# Patient Record
Sex: Female | Born: 1990 | Race: White | Hispanic: No | Marital: Married | Smoking: Never smoker
Health system: Southern US, Community
[De-identification: ages and names within clinical notes are randomized; demographics above are authoritative.]

## PROBLEM LIST (undated history)

## (undated) DIAGNOSIS — F419 Anxiety disorder, unspecified: Secondary | ICD-10-CM

## (undated) DIAGNOSIS — F5082 Avoidant/restrictive food intake disorder: Secondary | ICD-10-CM

## (undated) DIAGNOSIS — N83209 Unspecified ovarian cyst, unspecified side: Secondary | ICD-10-CM

## (undated) DIAGNOSIS — F429 Obsessive-compulsive disorder, unspecified: Secondary | ICD-10-CM

## (undated) DIAGNOSIS — F32A Depression, unspecified: Secondary | ICD-10-CM

## (undated) HISTORY — PX: WISDOM TOOTH EXTRACTION: SHX21

---

## 2021-05-22 ENCOUNTER — Emergency Department (HOSPITAL_COMMUNITY)
Admission: EM | Admit: 2021-05-22 | Discharge: 2021-05-22 | Disposition: A | Discharge status: Home / Self Care | Payer: BC Managed Care – PPO | Attending: Emergency Medicine | Admitting: Emergency Medicine

## 2021-05-22 ENCOUNTER — Emergency Department (HOSPITAL_COMMUNITY): Payer: BC Managed Care – PPO

## 2021-05-22 ENCOUNTER — Other Ambulatory Visit: Payer: Self-pay

## 2021-05-22 ENCOUNTER — Encounter (HOSPITAL_COMMUNITY): Payer: Self-pay | Admitting: Emergency Medicine

## 2021-05-22 DIAGNOSIS — R0602 Shortness of breath: Secondary | ICD-10-CM | POA: Diagnosis not present

## 2021-05-22 LAB — CBC WITH DIFFERENTIAL/PLATELET
Abs Immature Granulocytes: 0.01 10*3/uL (ref 0.00–0.07)
Basophils Absolute: 0 10*3/uL (ref 0.0–0.1)
Basophils Relative: 1 %
Eosinophils Absolute: 0 10*3/uL (ref 0.0–0.5)
Eosinophils Relative: 0 %
HCT: 42.8 % (ref 36.0–46.0)
Hemoglobin: 13.9 g/dL (ref 12.0–15.0)
Immature Granulocytes: 0 %
Lymphocytes Relative: 34 %
Lymphs Abs: 1.4 10*3/uL (ref 0.7–4.0)
MCH: 28.9 pg (ref 26.0–34.0)
MCHC: 32.5 g/dL (ref 30.0–36.0)
MCV: 89 fL (ref 80.0–100.0)
Monocytes Absolute: 0.3 10*3/uL (ref 0.1–1.0)
Monocytes Relative: 6 %
Neutro Abs: 2.4 10*3/uL (ref 1.7–7.7)
Neutrophils Relative %: 59 %
Platelets: 115 10*3/uL — ABNORMAL LOW (ref 150–400)
RBC: 4.81 MIL/uL (ref 3.87–5.11)
RDW: 12.7 % (ref 11.5–15.5)
WBC: 4 10*3/uL (ref 4.0–10.5)
nRBC: 0 % (ref 0.0–0.2)

## 2021-05-22 LAB — BASIC METABOLIC PANEL
Anion gap: 9 (ref 5–15)
BUN: 9 mg/dL (ref 6–20)
CO2: 27 mmol/L (ref 22–32)
Calcium: 9.9 mg/dL (ref 8.9–10.3)
Chloride: 104 mmol/L (ref 98–111)
Creatinine, Ser: 0.71 mg/dL (ref 0.44–1.00)
GFR, Estimated: >60 mL/min (ref >60)
Glucose, Bld: 90 mg/dL (ref 70–99)
Potassium: 3.5 mmol/L (ref 3.5–5.1)
Sodium: 140 mmol/L (ref 135–145)

## 2021-05-22 LAB — D-DIMER, QUANTITATIVE: D-Dimer, Quant: 0.31 ug/mL-FEU (ref 0.00–0.50)

## 2021-05-22 LAB — I-STAT BETA HCG BLOOD, ED (MC, WL, AP ONLY): I-stat hCG, quantitative: <5 m[IU]/mL (ref <5)

## 2021-05-22 MED ORDER — ALUM & MAG HYDROXIDE-SIMETH 200-200-20 MG/5ML PO SUSP
30.0000 mL | Freq: Once | ORAL | Status: DC
  Filled 2021-05-22: qty 30

## 2021-05-22 NOTE — ED Provider Notes (Signed)
[facility]-EMERGENCY DEPT Provider Note   CSN: [id number] Arrival date & time: 05/22/21  0228     History Chief Complaint  Patient presents with  . Shortness of Breath    Gabrielle Bryant is a 30 y.o. female.  30 yo F with a chief complaints of difficulty breathing.  Going on for about 48 hours or so.  Feels like he is having trouble breathing mostly on the right side.  Denies any cough congestion or fever.  Denies any overt pain.  She does feel a slight pressure or vibration on that side.  Denies abdominal pain.   Patient denies history of MI, denies hypertension hyperlipidemia diabetes or smoking.  Denies family history of MI.  Patient denies history of PE or DVT denies hemoptysis denies unilateral lower extremity edema denies recent surgery immobilization hospitalization estrogen use or history of cancer.   The history is provided by the patient.  Shortness of Breath Severity:  Moderate Onset quality:  Gradual Duration:  2 days Timing:  Constant Progression:  Worsening Chronicity:  New Relieved by:  Nothing Worsened by:  Nothing Ineffective treatments:  None tried Associated symptoms: no chest pain, no fever, no headaches, no vomiting and no wheezing        History reviewed. No pertinent past medical history.  There are no problems to display for this patient.   History reviewed. No pertinent surgical history.   OB History   No obstetric history on file.     No family history on file.     Home Medications Prior to Admission medications   Not on File    Allergies    Penicillins  Review of Systems   Review of Systems  Constitutional: Negative for chills and fever.  HENT: Negative for congestion and rhinorrhea.   Eyes: Negative for redness and visual disturbance.  Respiratory: Positive for shortness of breath. Negative for wheezing.   Cardiovascular: Negative for chest pain and palpitations.  Gastrointestinal: Negative for  nausea and vomiting.  Genitourinary: Negative for dysuria and urgency.  Musculoskeletal: Negative for arthralgias and myalgias.  Skin: Negative for pallor and wound.  Neurological: Negative for dizziness and headaches.    Physical Exam Updated Vital Signs BP 112/69   Pulse 79   Temp 97.8 F (36.6 C) (Oral)   Resp 12   Ht 5\' 5"  (1.651 m)   Wt 45.4 kg   LMP 05/15/2021 (Within Days)   SpO2 98%   BMI 16.64 kg/m   Physical Exam Vitals and nursing note reviewed.  Constitutional:      General: She is not in acute distress.    Appearance: She is well-developed. She is not diaphoretic.  HENT:     Head: Normocephalic and atraumatic.  Eyes:     Pupils: Pupils are equal, round, and reactive to light.  Cardiovascular:     Rate and Rhythm: Normal rate and regular rhythm.     Heart sounds: No murmur heard. No friction rub. No gallop.   Pulmonary:     Effort: Pulmonary effort is normal.     Breath sounds: Decreased breath sounds (in all fields) present. No wheezing or rales.  Abdominal:     General: There is no distension.     Palpations: Abdomen is soft.     Tenderness: There is no abdominal tenderness.  Musculoskeletal:        General: No tenderness.     Cervical back: Normal range of motion and neck supple.  Skin:  General: Skin is warm and dry.  Neurological:     Mental Status: She is alert and oriented to person, place, and time.  Psychiatric:        Behavior: Behavior normal.     ED Results / Procedures / Treatments   Labs (all labs ordered are listed, but only abnormal results are displayed) Labs Reviewed  CBC WITH DIFFERENTIAL/PLATELET - Abnormal; Notable for the following components:      Result Value   Platelets 115 (*)    All other components within normal limits  BASIC METABOLIC PANEL  D-DIMER, QUANTITATIVE  I-STAT BETA HCG BLOOD, ED ([facility], [facility], [facility] ONLY)    EKG None ED ECG REPORT   Date: 05/22/2021  Rate: 84  Rhythm: normal sinus rhythm  QRS Axis:  normal  Intervals: normal  ST/T Wave abnormalities: normal  Conduction Disutrbances:none  Narrative Interpretation:   Old EKG Reviewed: none available  I have personally reviewed the EKG tracing and agree with the computerized printout as noted.  Radiology DG Chest 2 View  Result Date: 05/22/2021 CLINICAL DATA:  Right-sided chest tightness when breathing EXAM: CHEST - 2 VIEW COMPARISON:  None. FINDINGS: The heart size and mediastinal contours are within normal limits. Both lungs are clear. The visualized skeletal structures are unremarkable. IMPRESSION: No active cardiopulmonary disease. Electronically Signed   By: Sharlet Salina M.D.   On: 05/22/2021 03:29    Procedures Procedures   Medications Ordered in ED Medications  alum & mag hydroxide-simeth (MAALOX/MYLANTA) 200-200-20 MG/5ML suspension 30 mL (has no administration in time range)    ED Course  I have reviewed the triage vital signs and the nursing notes.  Pertinent labs & imaging results that were available during my care of the patient were reviewed by me and considered in my medical decision making (see chart for details).    MDM Rules/Calculators/A&P                          30 yo F with a chief complaints of shortness of breath.  Going on for about 48 hours.  Patient tachycardic on arrival to the 120s.  Will obtain a laboratory evaluation chest x-ray reassess.  Chest x-ray viewed by me without focal infiltrate or pneumothorax.  Blood work without significant anemia D-dimer is negative we will discharge the patient home.  PCP follow-up.  5:27 AM:  I have discussed the diagnosis/risks/treatment options with the patient and family and believe the pt to be eligible for discharge home to follow-up with PCP. We also discussed returning to the ED immediately if new or worsening sx occur. We discussed the sx which are most concerning (e.g., sudden worsening pain, fever, inability to tolerate by mouth) that necessitate  immediate return. Medications administered to the patient during their visit and any new prescriptions provided to the patient are listed below.  Medications given during this visit Medications  alum & mag hydroxide-simeth (MAALOX/MYLANTA) 200-200-20 MG/5ML suspension 30 mL (has no administration in time range)     The patient appears reasonably screen and/or stabilized for discharge and I doubt any other medical condition or other [facility] requiring further screening, evaluation, or treatment in the ED at this time prior to discharge.   Final Clinical Impression(s) / ED Diagnoses Final diagnoses:  SOB (shortness of breath)    Rx / DC Orders ED Discharge Orders    None       Melene Plan, DO 05/22/21 [phone]

## 2021-05-22 NOTE — ED Triage Notes (Signed)
Pt came in with c/o R sided SOB. She states she feels like she cannot get enough air in. She states "I feel like something is buzzing in my lung". Pt states it has been off and on for a few days

## 2021-05-22 NOTE — Discharge Instructions (Signed)
Follow up with your family doc. Return for worsening symptoms.   Try pepcid or tagamet up to twice a day.  Try to avoid things that may make this worse, most commonly these are spicy foods tomato based products fatty foods chocolate and peppermint.  Alcohol and tobacco can also make this worse.  Return to the emergency department for sudden worsening pain fever or inability to eat or drink.

## 2021-06-24 DIAGNOSIS — F429 Obsessive-compulsive disorder, unspecified: Secondary | ICD-10-CM | POA: Insufficient documentation

## 2021-06-24 DIAGNOSIS — F32A Depression, unspecified: Secondary | ICD-10-CM | POA: Insufficient documentation

## 2021-06-24 DIAGNOSIS — F419 Anxiety disorder, unspecified: Secondary | ICD-10-CM | POA: Insufficient documentation

## 2021-06-24 DIAGNOSIS — F5082 Avoidant/restrictive food intake disorder: Secondary | ICD-10-CM | POA: Insufficient documentation

## 2021-08-04 DIAGNOSIS — D696 Thrombocytopenia, unspecified: Secondary | ICD-10-CM | POA: Insufficient documentation

## 2021-10-03 ENCOUNTER — Encounter (HOSPITAL_COMMUNITY): Payer: Self-pay | Admitting: Emergency Medicine

## 2021-10-03 ENCOUNTER — Other Ambulatory Visit: Payer: Self-pay

## 2021-10-03 ENCOUNTER — Emergency Department (HOSPITAL_COMMUNITY)
Admission: EM | Admit: 2021-10-03 | Discharge: 2021-10-04 | Disposition: A | Discharge status: Home / Self Care | Payer: 59 | Attending: Emergency Medicine | Admitting: Emergency Medicine

## 2021-10-03 DIAGNOSIS — R42 Dizziness and giddiness: Secondary | ICD-10-CM | POA: Diagnosis not present

## 2021-10-03 DIAGNOSIS — Z5321 Procedure and treatment not carried out due to patient leaving prior to being seen by health care provider: Secondary | ICD-10-CM | POA: Diagnosis not present

## 2021-10-03 HISTORY — DX: Depression, unspecified: F32.A

## 2021-10-03 HISTORY — DX: Unspecified ovarian cyst, unspecified side: N83.209

## 2021-10-03 HISTORY — DX: Obsessive-compulsive disorder, unspecified: F42.9

## 2021-10-03 HISTORY — DX: Avoidant/restrictive food intake disorder: F50.82

## 2021-10-03 HISTORY — DX: Anxiety disorder, unspecified: F41.9

## 2021-10-03 LAB — CBC WITH DIFFERENTIAL/PLATELET
Abs Immature Granulocytes: 0.02 10*3/uL (ref 0.00–0.07)
Basophils Absolute: 0 10*3/uL (ref 0.0–0.1)
Basophils Relative: 1 %
Eosinophils Absolute: 0 10*3/uL (ref 0.0–0.5)
Eosinophils Relative: 0 %
HCT: 39.3 % (ref 36.0–46.0)
Hemoglobin: 12.8 g/dL (ref 12.0–15.0)
Immature Granulocytes: 0 %
Lymphocytes Relative: 24 %
Lymphs Abs: 1.3 10*3/uL (ref 0.7–4.0)
MCH: 30 pg (ref 26.0–34.0)
MCHC: 32.6 g/dL (ref 30.0–36.0)
MCV: 92.3 fL (ref 80.0–100.0)
Monocytes Absolute: 0.2 10*3/uL (ref 0.1–1.0)
Monocytes Relative: 4 %
Neutro Abs: 3.7 10*3/uL (ref 1.7–7.7)
Neutrophils Relative %: 71 %
Platelets: 146 10*3/uL — ABNORMAL LOW (ref 150–400)
RBC: 4.26 MIL/uL (ref 3.87–5.11)
RDW: 12 % (ref 11.5–15.5)
WBC: 5.2 10*3/uL (ref 4.0–10.5)
nRBC: 0 % (ref 0.0–0.2)

## 2021-10-03 LAB — BASIC METABOLIC PANEL
Anion gap: 9 (ref 5–15)
BUN: 10 mg/dL (ref 6–20)
CO2: 25 mmol/L (ref 22–32)
Calcium: 9.7 mg/dL (ref 8.9–10.3)
Chloride: 106 mmol/L (ref 98–111)
Creatinine, Ser: 0.62 mg/dL (ref 0.44–1.00)
GFR, Estimated: >60 mL/min (ref >60)
Glucose, Bld: 92 mg/dL (ref 70–99)
Potassium: 4.2 mmol/L (ref 3.5–5.1)
Sodium: 140 mmol/L (ref 135–145)

## 2021-10-03 LAB — MAGNESIUM: Magnesium: 2.3 mg/dL (ref 1.7–2.4)

## 2021-10-03 NOTE — ED Provider Notes (Signed)
Emergency Medicine Provider Triage Evaluation Note  Gabrielle Bryant , a 30 y.o. female  was evaluated in triage.  Pt complains of dizziness.  Reports that dizziness has been present since Friday.  Patient reports that she thinks a ovarian cyst burst Friday morning.  Patient is currently on her menstrual period.  Patient is changing her pad or tampon approximately 3-4 times daily.  Review of Systems  Positive: Dizziness Negative: Chest pain, shortness of breath, syncope,  Physical Exam  BP 126/89 (BP Location: Left Arm)   Pulse 94   Temp 98 F (36.7 C) (Oral)   Resp 16   Ht 5\' 5"  (1.651 m)   Wt 46.3 kg   LMP 09/30/2021 (Exact Date)   SpO2 98%   BMI 16.97 kg/m  Gen:   Awake, no distress   Resp:  Normal effort  MSK:   Moves extremities without difficulty  Other:  Abdomen soft, nondistended, nontender.  Grip strength equal.  Pronator drift negative.  No dysarthria.  Medical Decision Making  Medically screening exam initiated at 4:59 PM.  Appropriate orders placed.  Gabrielle Bryant was informed that the remainder of the evaluation will be completed by another provider, this initial triage assessment does not replace that evaluation, and the importance of remaining in the ED until their evaluation is complete.     Linna Hoff, PA-C 10/03/21 1700    12/03/21, MD 10/03/21 [phone]

## 2021-10-03 NOTE — ED Triage Notes (Signed)
States she had an ovarian cyst bust Friday, states she has felt dizzy since then. Hx of them bursting, has never been dizzy afterwards. Ran into a wall this morning.   Took 3 pregnancy tests in the past week and they were all negative.

## 2021-10-04 LAB — PREGNANCY, URINE: Preg Test, Ur: NEGATIVE

## 2021-10-04 NOTE — ED Notes (Signed)
Labeled urine specimen and culture sent to lab. ENMiles 

## 2021-10-05 ENCOUNTER — Emergency Department (HOSPITAL_BASED_OUTPATIENT_CLINIC_OR_DEPARTMENT_OTHER): Payer: 59

## 2021-10-05 ENCOUNTER — Encounter (HOSPITAL_BASED_OUTPATIENT_CLINIC_OR_DEPARTMENT_OTHER): Payer: Self-pay

## 2021-10-05 ENCOUNTER — Other Ambulatory Visit: Payer: Self-pay

## 2021-10-05 ENCOUNTER — Emergency Department (HOSPITAL_BASED_OUTPATIENT_CLINIC_OR_DEPARTMENT_OTHER)
Admission: EM | Admit: 2021-10-05 | Discharge: 2021-10-05 | Disposition: A | Discharge status: Home / Self Care | Payer: 59 | Attending: Emergency Medicine | Admitting: Emergency Medicine

## 2021-10-05 ENCOUNTER — Emergency Department (HOSPITAL_BASED_OUTPATIENT_CLINIC_OR_DEPARTMENT_OTHER)
Admission: RE | Admit: 2021-10-05 | Discharge: 2021-10-05 | Disposition: A | Discharge status: Home / Self Care | Payer: 59 | Source: Ambulatory Visit | Attending: Emergency Medicine | Admitting: Emergency Medicine

## 2021-10-05 DIAGNOSIS — R1032 Left lower quadrant pain: Secondary | ICD-10-CM | POA: Insufficient documentation

## 2021-10-05 DIAGNOSIS — R519 Headache, unspecified: Secondary | ICD-10-CM | POA: Insufficient documentation

## 2021-10-05 DIAGNOSIS — R42 Dizziness and giddiness: Secondary | ICD-10-CM | POA: Insufficient documentation

## 2021-10-05 LAB — HEMOGLOBIN AND HEMATOCRIT, BLOOD
HCT: 38.8 % (ref 36.0–46.0)
Hemoglobin: 12.7 g/dL (ref 12.0–15.0)

## 2021-10-05 MED ORDER — MECLIZINE HCL 25 MG PO TABS: 25.0000 mg | ORAL_TABLET | Freq: Three times a day (TID) | ORAL | 0 refills | Status: DC | PRN

## 2021-10-05 MED ORDER — MECLIZINE HCL 25 MG PO TABS
25.0000 mg | ORAL_TABLET | Freq: Once | ORAL | Status: AC
  Administered 2021-10-05: 25 mg via ORAL
  Filled 2021-10-05: qty 1

## 2021-10-05 NOTE — ED Provider Notes (Signed)
[facility] EMERGENCY DEPT Provider Note   CSN: [id number] Arrival date & time: 10/05/21  0138     History Chief Complaint  Patient presents with   Headache   Dizziness    Annaclaire Walsworth is a 30 y.o. female.  Patient is a 30 year old female with past medical history of ovarian cyst, anxiety, depression.  Patient presenting with a several day history of dizziness.  She describes feeling off balance and also reports headache.  She was triaged at [facility], but did not stay for results yesterday due to prolonged wait time.  She does not know the results of the studies.  Patient also reports feeling as though a cyst may have ruptured on her ovary this past Friday, then her menstrual period began afterward.  She denies abdominal pain or vaginal discharge.  She denies fevers, chills, or stiff neck.  The history is provided by the patient.  Headache Pain location:  Generalized Quality:  Dull Radiates to:  Does not radiate Timing:  Constant Progression:  Worsening Chronicity:  New Associated symptoms: dizziness   Dizziness Associated symptoms: headaches       Past Medical History:  Diagnosis Date   Anxiety    Avoidant-restrictive food intake disorder (ARFID)    Depression    OCD (obsessive compulsive disorder)    Ovarian cyst     There are no problems to display for this patient.   Past Surgical History:  Procedure Laterality Date   WISDOM TOOTH EXTRACTION Bilateral      OB History   No obstetric history on file.     No family history on file.  Social History   Tobacco Use   Smoking status: Never   Smokeless tobacco: Never  Substance Use Topics   Alcohol use: Not Currently   Drug use: Not Currently    Home Medications Prior to Admission medications   Not on File    Allergies    Penicillins  Review of Systems   Review of Systems  Neurological:  Positive for dizziness and headaches.  All other systems reviewed and are  negative.  Physical Exam Updated Vital Signs BP (!) 126/92 (BP Location: Right Arm)   Pulse 75   Temp 98.3 F (36.8 C) (Oral)   Resp 16   Ht 5\' 5"  (1.651 m)   Wt 46.5 kg   LMP 09/30/2021 (Exact Date)   SpO2 100%   BMI 17.07 kg/m   Physical Exam Vitals and nursing note reviewed.  Constitutional:      General: She is not in acute distress.    Appearance: She is well-developed. She is not diaphoretic.  HENT:     Head: Normocephalic and atraumatic.  Eyes:     General: No visual field deficit.    Extraocular Movements: Extraocular movements intact.     Pupils: Pupils are equal, round, and reactive to light.  Cardiovascular:     Rate and Rhythm: Normal rate and regular rhythm.     Heart sounds: No murmur heard.   No friction rub. No gallop.  Pulmonary:     Effort: Pulmonary effort is normal. No respiratory distress.     Breath sounds: Normal breath sounds. No wheezing.  Abdominal:     General: Bowel sounds are normal. There is no distension.     Palpations: Abdomen is soft.     Tenderness: There is no abdominal tenderness.  Musculoskeletal:        General: Normal range of motion.     Cervical back:  Normal range of motion and neck supple.  Skin:    General: Skin is warm and dry.  Neurological:     General: No focal deficit present.     Mental Status: She is alert and oriented to person, place, and time.     Cranial Nerves: No cranial nerve deficit or facial asymmetry.     Sensory: No sensory deficit.     Motor: No weakness.     Coordination: Coordination normal.    ED Results / Procedures / Treatments   Labs (all labs ordered are listed, but only abnormal results are displayed) Labs Reviewed  HEMOGLOBIN AND HEMATOCRIT, BLOOD    EKG None  Radiology No results found.  Procedures Procedures   Medications Ordered in ED Medications  meclizine (ANTIVERT) tablet 25 mg (has no administration in time range)    ED Course  I have reviewed the triage vital signs  and the nursing notes.  Pertinent labs & imaging results that were available during my care of the patient were reviewed by me and considered in my medical decision making (see chart for details).    MDM Rules/Calculators/A&P  Patient presenting with dizziness as described in the HPI.  I suspect a peripheral vertigo.  She also describes an episode of left lower quadrant discomfort this past Friday followed by her menstrual period.  She has had a cyst on her ovary rupture in the past and believes this may have happened again.  Her hemoglobin checked yesterday was normal and repeated today and is the same.  I highly doubt anemia or blood loss as the cause of her dizziness.  She is neurologically intact and head CT is unremarkable.  Dizziness most likely related to a peripheral vertigo.  Patient to be treated with meclizine and as needed return.  Final Clinical Impression(s) / ED Diagnoses Final diagnoses:  None    Rx / DC Orders ED Discharge Orders     None        Geoffery Lyons, MD 10/05/21 [phone]

## 2021-10-05 NOTE — ED Provider Notes (Signed)
Patient returns the emergency room today for pelvic ultrasound as ordered during her overnight ER visit when imaging was not available.  Ultrasound reviewed with patient, unremarkable exam.  Recommend follow-up tomorrow as planned.   Jeannie Fend, PA-C 10/05/21 1632    Virgina Norfolk, DO 10/05/21 1724

## 2021-10-05 NOTE — Discharge Instructions (Addendum)
Begin taking meclizine as prescribed as needed for dizziness.  Return tomorrow at the given time for an ultrasound to evaluate her ovary.  Follow-up with primary doctor if not improving in the next week, and return to the ER symptoms significantly worsen or change.

## 2021-10-05 NOTE — ED Triage Notes (Signed)
Patient here POV for Headache and Dizziness.  Patient states she believes she had an Ovarian Cyst Rupture on Friday. Pain has since subsided since but since Sunday the Patient has developed a Headache and Dizziness.   NAD Noted during Triage. Ambulatory. A&Ox4. GCS 15. No Cp, No SOB. No N/V/D.

## 2022-03-23 ENCOUNTER — Emergency Department (HOSPITAL_BASED_OUTPATIENT_CLINIC_OR_DEPARTMENT_OTHER)
Admission: EM | Admit: 2022-03-23 | Discharge: 2022-03-23 | Disposition: A | Discharge status: Home / Self Care | Payer: 59 | Attending: Emergency Medicine | Admitting: Emergency Medicine

## 2022-03-23 ENCOUNTER — Encounter (HOSPITAL_BASED_OUTPATIENT_CLINIC_OR_DEPARTMENT_OTHER): Payer: Self-pay | Admitting: Emergency Medicine

## 2022-03-23 ENCOUNTER — Other Ambulatory Visit: Payer: Self-pay

## 2022-03-23 DIAGNOSIS — R1032 Left lower quadrant pain: Secondary | ICD-10-CM | POA: Diagnosis not present

## 2022-03-23 LAB — COMPREHENSIVE METABOLIC PANEL
ALT: 14 U/L (ref 0–44)
AST: 19 U/L (ref 15–41)
Albumin: 5.1 g/dL — ABNORMAL HIGH (ref 3.5–5.0)
Alkaline Phosphatase: 55 U/L (ref 38–126)
Anion gap: 9 (ref 5–15)
BUN: 12 mg/dL (ref 6–20)
CO2: 27 mmol/L (ref 22–32)
Calcium: 10.4 mg/dL — ABNORMAL HIGH (ref 8.9–10.3)
Chloride: 106 mmol/L (ref 98–111)
Creatinine, Ser: 0.72 mg/dL (ref 0.44–1.00)
GFR, Estimated: >60 mL/min (ref >60)
Glucose, Bld: 82 mg/dL (ref 70–99)
Potassium: 3.8 mmol/L (ref 3.5–5.1)
Sodium: 142 mmol/L (ref 135–145)
Total Bilirubin: 0.7 mg/dL (ref 0.3–1.2)
Total Protein: 7.9 g/dL (ref 6.5–8.1)

## 2022-03-23 LAB — URINALYSIS, ROUTINE W REFLEX MICROSCOPIC
Bilirubin Urine: NEGATIVE
Glucose, UA: NEGATIVE mg/dL
Hgb urine dipstick: NEGATIVE
Ketones, ur: NEGATIVE mg/dL
Leukocytes,Ua: NEGATIVE
Nitrite: NEGATIVE
Protein, ur: NEGATIVE mg/dL
Specific Gravity, Urine: <1.005 — ABNORMAL LOW (ref 1.005–1.030)
pH: 6.5 (ref 5.0–8.0)

## 2022-03-23 LAB — CBC
HCT: 41 % (ref 36.0–46.0)
Hemoglobin: 13.4 g/dL (ref 12.0–15.0)
MCH: 29.2 pg (ref 26.0–34.0)
MCHC: 32.7 g/dL (ref 30.0–36.0)
MCV: 89.3 fL (ref 80.0–100.0)
Platelets: 152 10*3/uL (ref 150–400)
RBC: 4.59 MIL/uL (ref 3.87–5.11)
RDW: 12.1 % (ref 11.5–15.5)
WBC: 3.5 10*3/uL — ABNORMAL LOW (ref 4.0–10.5)
nRBC: 0 % (ref 0.0–0.2)

## 2022-03-23 LAB — PREGNANCY, URINE: Preg Test, Ur: NEGATIVE

## 2022-03-23 LAB — LIPASE, BLOOD: Lipase: 56 U/L — ABNORMAL HIGH (ref 11–51)

## 2022-03-23 NOTE — Discharge Instructions (Addendum)
I suspect that your abdominal pain is related to gas.  As we discussed there are other possibilities however given that your symptoms have completely resolved I think holding off on any additional work-up at this time is very reasonable.  Please see your OB/GYN and primary care provider in follow-up.  You may always return to the emergency room for any new or concerning symptoms.  Drink plenty of water due to the abdominal manipulation exercises that we discussed--rolling/rocking on her belly and ?

## 2022-03-23 NOTE — ED Triage Notes (Signed)
Pt arrives to ED with c/o abdominal pain. The pain is located LLQ. This started this afternoon. The pain is described as sharp. Pain was a 10/10 but now a 5/10. No N/V/D.  ?

## 2022-03-23 NOTE — ED Provider Notes (Signed)
?[facility] EMER[facility]ENCY DEPT ?Provider Note ? ? ?CSN: [id number] ?Arrival date & time: 03/23/22  1251 ? ?  ? ?History ? ?Chief Complaint  ?Patient presents with  ? Abdominal Pain  ? ? ?Gabrielle Bryant is a 31 y.o. female. ? ? ?Abdominal Pain ? ?Patient is a 31 year old female with past medical history without any pertinent positives other than history of ovarian cysts, anxiety, OCD, depression ? ?Patient is present emergency room today with complaints of abdominal pain that started this afternoon approximately located in the left lower quadrant of the abdomen she states it was 10/10 but then improved to 5/10 and is now completely resolved.  She states that the pain lasted about 1 hour. ? ?She denies any nausea vomiting diarrhea no blood in stool no fevers she denies any cough congestion lightheadedness dizziness.  Denies any vaginal discharge or recent dyspareunia.  She denies any other associated symptoms. ? ? ?  ? ?Home Medications ?Prior to Admission medications   ?Medication Sig Start Date End Date Taking? Authorizing Provider  ?meclizine (ANTIVERT) 25 MG tablet Take 1 tablet (25 mg total) by mouth 3 (three) times daily as needed for dizziness. 10/05/21   Geoffery Lyons, MD  ?   ? ?Allergies    ?Nitrofurantoin and Penicillins   ? ?Review of Systems   ?Review of Systems  ?Gastrointestinal:  Positive for abdominal pain.  ? ?Physical Exam ?Updated Vital Signs ?BP 110/90   Pulse 75   Temp 97.7 ?F (36.5 ?C)   Resp 18   Ht 5\' 5"  (1.651 m)   Wt 47.2 kg   SpO2 100%   BMI 17.31 kg/m?  ?Physical Exam ?Vitals and nursing note reviewed.  ?Constitutional:   ?   General: She is not in acute distress. ?   Comments: Pleasant well-appearing 32 year old.  In no acute distress.  Sitting comfortably in bed.  Able answer questions appropriately follow commands. No increased work of breathing. Speaking in full sentences.   ?HENT:  ?   Head: Normocephalic and atraumatic.  ?   Nose: Nose normal.  ?Eyes:  ?   General: No  scleral icterus. ?Cardiovascular:  ?   Rate and Rhythm: Normal rate and regular rhythm.  ?   Pulses: Normal pulses.  ?   Heart sounds: Normal heart sounds.  ?Pulmonary:  ?   Effort: Pulmonary effort is normal. No respiratory distress.  ?   Breath sounds: No wheezing.  ?Abdominal:  ?   General: Abdomen is flat.  ?   Palpations: Abdomen is soft.  ?   Tenderness: There is no abdominal tenderness. There is no right CVA tenderness, left CVA tenderness, guarding or rebound.  ?Musculoskeletal:  ?   Cervical back: Normal range of motion.  ?   Right lower leg: No edema.  ?   Left lower leg: No edema.  ?Skin: ?   General: Skin is warm and dry.  ?   Capillary Refill: Capillary refill takes less than 2 seconds.  ?Neurological:  ?   Mental Status: She is alert. Mental status is at baseline.  ?Psychiatric:     ?   Mood and Affect: Mood normal.     ?   Behavior: Behavior normal.  ? ? ?ED Results / Procedures / Treatments   ?Labs ?(all labs ordered are listed, but only abnormal results are displayed) ?Labs Reviewed  ?URINALYSIS, ROUTINE W REFLEX MICROSCOPIC - Abnormal; Notable for the following components:  ?    Result Value  ? Specific Gravity, Urine <  1.005 (*)   ? All other components within normal limits  ?LIPASE, BLOOD - Abnormal; Notable for the following components:  ? Lipase 56 (*)   ? All other components within normal limits  ?COMPREHENSIVE METABOLIC PANEL - Abnormal; Notable for the following components:  ? Calcium 10.4 (*)   ? Albumin 5.1 (*)   ? All other components within normal limits  ?CBC - Abnormal; Notable for the following components:  ? WBC 3.5 (*)   ? All other components within normal limits  ?PREGNANCY, URINE  ? ? ?EKG ?None ? ?Radiology ?No results found. ? ?Procedures ?Procedures  ? ? ?Medications Ordered in ED ?Medications - No data to display ? ?ED Course/ Medical Decision Making/ A&P ?  ?                        ?Medical Decision Making ?Amount and/or Complexity of Data Reviewed ?Labs:  ordered. ? ? ?Patient is a 31 year old female with past medical history without any pertinent positives other than history of ovarian cysts, anxiety, OCD, depression ? ?Patient is present emergency room today with complaints of abdominal pain that started this afternoon approximately located in the left lower quadrant of the abdomen she states it was 10/10 but then improved to 5/10 and is now completely resolved.  She states that the pain lasted about 1 hour. ? ?She denies any nausea vomiting diarrhea no blood in stool no fevers she denies any cough congestion lightheadedness dizziness.  Denies any vaginal discharge or recent dyspareunia.  She denies any other associated symptoms. ? ? ?Physical exam unremarkable does have some gurgling with palpation of abdomen consistent with perhaps some bowel gas. ? ? ?Patient symptoms have completely resolved. ? ?The causes of generalized abdominal pain include but are not limited to AAA, mesenteric ischemia, appendicitis, diverticulitis, DKA, gastritis, gastroenteritis, AMI, nephrolithiasis, pancreatitis, peritonitis, adrenal insufficiency,lead poisoning, iron toxicity, intestinal ischemia, constipation, UTI,SBO/LBO, splenic rupture, biliary disease, IBD, IBS, PUD, or hepatitis. ?Ectopic pregnancy, ovarian torsion, PID.  ? ? ?Given that her symptoms are greatly resolved a low suspicion for the above causes of her abdominal pain.  She may have some gas causing her pain. ? ?I did offer pelvic exam for further evaluation which she declined.  We did show decision-making physician about neck steps will hold off on any further evaluation given that she is completely resolved at this time she will follow-up with PCP return precautions were given she will follow-up with OB/GYN ? ? ?Final Clinical Impression(s) / ED Diagnoses ?Final diagnoses:  ?Left lower quadrant abdominal pain  ? ? ?Rx / DC Orders ?ED Discharge Orders   ? ? None  ? ?  ? ? ?  ?Gailen Shelter, [place] ?03/23/22 1556 ? ?   ?Benjiman Core, MD ?03/24/22 0018 ? ?

## 2022-11-13 DIAGNOSIS — E282 Polycystic ovarian syndrome: Secondary | ICD-10-CM | POA: Insufficient documentation

## 2023-03-08 ENCOUNTER — Other Ambulatory Visit: Payer: Self-pay

## 2023-03-08 ENCOUNTER — Encounter (HOSPITAL_BASED_OUTPATIENT_CLINIC_OR_DEPARTMENT_OTHER): Payer: Self-pay

## 2023-03-08 ENCOUNTER — Emergency Department (HOSPITAL_BASED_OUTPATIENT_CLINIC_OR_DEPARTMENT_OTHER)
Admission: EM | Admit: 2023-03-08 | Discharge: 2023-03-09 | Disposition: A | Discharge status: Home / Self Care | Payer: 59 | Attending: Emergency Medicine | Admitting: Emergency Medicine

## 2023-03-08 DIAGNOSIS — R1031 Right lower quadrant pain: Secondary | ICD-10-CM | POA: Diagnosis present

## 2023-03-08 DIAGNOSIS — N3000 Acute cystitis without hematuria: Secondary | ICD-10-CM | POA: Insufficient documentation

## 2023-03-08 DIAGNOSIS — K5900 Constipation, unspecified: Secondary | ICD-10-CM | POA: Diagnosis not present

## 2023-03-08 LAB — BASIC METABOLIC PANEL
Anion gap: 4 — ABNORMAL LOW (ref 5–15)
BUN: 13 mg/dL (ref 6–20)
CO2: 24 mmol/L (ref 22–32)
Calcium: 8.4 mg/dL — ABNORMAL LOW (ref 8.9–10.3)
Chloride: 103 mmol/L (ref 98–111)
Creatinine, Ser: 0.74 mg/dL (ref 0.44–1.00)
GFR, Estimated: >60 mL/min (ref >60)
Glucose, Bld: 98 mg/dL (ref 70–99)
Potassium: 3.9 mmol/L (ref 3.5–5.1)
Sodium: 131 mmol/L — ABNORMAL LOW (ref 135–145)

## 2023-03-08 LAB — CBC WITH DIFFERENTIAL/PLATELET
Abs Immature Granulocytes: 0.01 10*3/uL (ref 0.00–0.07)
Basophils Absolute: 0 10*3/uL (ref 0.0–0.1)
Basophils Relative: 1 %
Eosinophils Absolute: 0.1 10*3/uL (ref 0.0–0.5)
Eosinophils Relative: 1 %
HCT: 39.5 % (ref 36.0–46.0)
Hemoglobin: 13.3 g/dL (ref 12.0–15.0)
Immature Granulocytes: 0 %
Lymphocytes Relative: 39 %
Lymphs Abs: 1.9 10*3/uL (ref 0.7–4.0)
MCH: 29.8 pg (ref 26.0–34.0)
MCHC: 33.7 g/dL (ref 30.0–36.0)
MCV: 88.4 fL (ref 80.0–100.0)
Monocytes Absolute: 0.3 10*3/uL (ref 0.1–1.0)
Monocytes Relative: 6 %
Neutro Abs: 2.5 10*3/uL (ref 1.7–7.7)
Neutrophils Relative %: 53 %
Platelets: 190 10*3/uL (ref 150–400)
RBC: 4.47 MIL/uL (ref 3.87–5.11)
RDW: 11.9 % (ref 11.5–15.5)
WBC: 4.8 10*3/uL (ref 4.0–10.5)
nRBC: 0 % (ref 0.0–0.2)

## 2023-03-08 NOTE — ED Triage Notes (Signed)
Pt to ED by POV from home c/o RLQ abdominal pain following a rupture of an ovarian cyst on Monday. Pt also endorses nausea and dizziness. Pt had [place] on Tuesday that confirmed there was still fluid from the rupture.Arrives A+O, VSS.

## 2023-03-09 ENCOUNTER — Emergency Department (HOSPITAL_BASED_OUTPATIENT_CLINIC_OR_DEPARTMENT_OTHER): Payer: 59

## 2023-03-09 LAB — URINALYSIS, MICROSCOPIC (REFLEX)

## 2023-03-09 LAB — PREGNANCY, URINE: Preg Test, Ur: NEGATIVE

## 2023-03-09 LAB — URINALYSIS, ROUTINE W REFLEX MICROSCOPIC
Bilirubin Urine: NEGATIVE
Glucose, UA: NEGATIVE mg/dL
Hgb urine dipstick: NEGATIVE
Ketones, ur: NEGATIVE mg/dL
Nitrite: NEGATIVE
Protein, ur: NEGATIVE mg/dL
Specific Gravity, Urine: 1.015 (ref 1.005–1.030)
pH: 7 (ref 5.0–8.0)

## 2023-03-09 MED ORDER — SULFAMETHOXAZOLE-TRIMETHOPRIM 800-160 MG PO TABS
1.0000 | ORAL_TABLET | Freq: Once | ORAL | Status: AC
  Administered 2023-03-09: 1 via ORAL
  Filled 2023-03-09: qty 1

## 2023-03-09 MED ORDER — NAPROXEN 250 MG PO TABS
500.0000 mg | ORAL_TABLET | Freq: Once | ORAL | Status: AC
Start: 2023-03-09 — End: 2023-03-09
  Administered 2023-03-09: 500 mg via ORAL
  Filled 2023-03-09: qty 2

## 2023-03-09 MED ORDER — SULFAMETHOXAZOLE-TRIMETHOPRIM 800-160 MG PO TABS: 1.0000 | ORAL_TABLET | Freq: Two times a day (BID) | ORAL | 0 refills | Status: AC

## 2023-03-09 NOTE — ED Provider Notes (Signed)
[facility] Provider Note   CSN: [id number] Arrival date & time: 03/08/23  2323     History  Chief Complaint  Patient presents with   Abdominal Pain    Gabrielle Bryant is a 32 y.o. female.  The history is provided by the patient.  Abdominal Pain Pain location:  RLQ Pain radiates to:  Does not radiate Pain severity:  Moderate Onset quality:  Gradual Timing:  Constant Chronicity:  New Context: not recent travel   Relieved by:  Nothing Worsened by:  Nothing Associated symptoms: no anorexia, no fever and no vomiting   Risk factors: has not had multiple surgeries and not pregnant   Patient was diagnosed with ruptured cyst on Monday presents with ongoing RLQ pain.  No fevers.       Home Medications Prior to Admission medications   Medication Sig Start Date End Date Taking? Authorizing Provider  sulfamethoxazole-trimethoprim (BACTRIM DS) 800-160 MG tablet Take 1 tablet by mouth 2 (two) times daily for 7 days. 03/09/23 03/16/23 Yes Treyce Spillers, MD  meclizine (ANTIVERT) 25 MG tablet Take 1 tablet (25 mg total) by mouth 3 (three) times daily as needed for dizziness. 10/05/21   Veryl Speak, MD      Allergies    Nitrofurantoin and Penicillins    Review of Systems   Review of Systems  Constitutional:  Negative for fever.  HENT:  Negative for facial swelling.   Eyes:  Negative for redness.  Respiratory:  Negative for wheezing and stridor.   Gastrointestinal:  Positive for abdominal pain. Negative for anorexia and vomiting.  All other systems reviewed and are negative.   Physical Exam Updated Vital Signs BP 110/85 (BP Location: Right Arm)   Pulse 77   Temp 98.3 F (36.8 C) (Oral)   Resp 16   Ht 5\' 5"  (1.651 m)   Wt 47.2 kg   SpO2 100%   BMI 17.31 kg/m  Physical Exam Vitals and nursing note reviewed.  Constitutional:      General: She is not in acute distress.    Appearance: Normal appearance. She is well-developed.   HENT:     Head: Normocephalic and atraumatic.     Nose: Nose normal.  Eyes:     Pupils: Pupils are equal, round, and reactive to light.  Cardiovascular:     Rate and Rhythm: Normal rate and regular rhythm.     Pulses: Normal pulses.     Heart sounds: Normal heart sounds.  Pulmonary:     Effort: Pulmonary effort is normal. No respiratory distress.     Breath sounds: Normal breath sounds.  Abdominal:     General: Bowel sounds are normal. There is no distension.     Palpations: Abdomen is soft.     Tenderness: There is no abdominal tenderness. There is no guarding or rebound. Negative signs include Murphy's sign and McBurney's sign.     Comments: Stool palpable   Genitourinary:    Vagina: No vaginal discharge.  Musculoskeletal:        General: Normal range of motion.     Cervical back: Neck supple.  Skin:    General: Skin is warm and dry.     Capillary Refill: Capillary refill takes less than 2 seconds.     Findings: No erythema or rash.  Neurological:     General: No focal deficit present.     Mental Status: She is alert and oriented to person, place, and time.  Deep Tendon Reflexes: Reflexes normal.  Psychiatric:        Mood and Affect: Mood normal.        Behavior: Behavior normal.     ED Results / Procedures / Treatments   Labs (all labs ordered are listed, but only abnormal results are displayed) Results for orders placed or performed during the hospital encounter of 03/08/23  Pregnancy, urine  Result Value Ref Range   Preg Test, Ur NEGATIVE NEGATIVE  Urinalysis, Routine w reflex microscopic -Urine, Clean Catch  Result Value Ref Range   Color, Urine YELLOW YELLOW   APPearance HAZY (A) CLEAR   Specific Gravity, Urine 1.015 1.005 - 1.030   pH 7.0 5.0 - 8.0   Glucose, UA NEGATIVE NEGATIVE mg/dL   Hgb urine dipstick NEGATIVE NEGATIVE   Bilirubin Urine NEGATIVE NEGATIVE   Ketones, ur NEGATIVE NEGATIVE mg/dL   Protein, ur NEGATIVE NEGATIVE mg/dL   Nitrite  NEGATIVE NEGATIVE   Leukocytes,Ua SMALL (A) NEGATIVE  CBC with Differential  Result Value Ref Range   WBC 4.8 4.0 - 10.5 K/uL   RBC 4.47 3.87 - 5.11 MIL/uL   Hemoglobin 13.3 12.0 - 15.0 g/dL   HCT 39.5 36.0 - 46.0 %   MCV 88.4 80.0 - 100.0 fL   MCH 29.8 26.0 - 34.0 pg   MCHC 33.7 30.0 - 36.0 g/dL   RDW 11.9 11.5 - 15.5 %   Platelets 190 150 - 400 K/uL   nRBC 0.0 0.0 - 0.2 %   Neutrophils Relative % 53 %   Neutro Abs 2.5 1.7 - 7.7 K/uL   Lymphocytes Relative 39 %   Lymphs Abs 1.9 0.7 - 4.0 K/uL   Monocytes Relative 6 %   Monocytes Absolute 0.3 0.1 - 1.0 K/uL   Eosinophils Relative 1 %   Eosinophils Absolute 0.1 0.0 - 0.5 K/uL   Basophils Relative 1 %   Basophils Absolute 0.0 0.0 - 0.1 K/uL   Immature Granulocytes 0 %   Abs Immature Granulocytes 0.01 0.00 - 0.07 K/uL  Basic metabolic panel  Result Value Ref Range   Sodium 131 (L) 135 - 145 mmol/L   Potassium 3.9 3.5 - 5.1 mmol/L   Chloride 103 98 - 111 mmol/L   CO2 24 22 - 32 mmol/L   Glucose, Bld 98 70 - 99 mg/dL   BUN 13 6 - 20 mg/dL   Creatinine, Ser 0.74 0.44 - 1.00 mg/dL   Calcium 8.4 (L) 8.9 - 10.3 mg/dL   GFR, Estimated >60 >60 mL/min   Anion gap 4 (L) 5 - 15  Urinalysis, Microscopic (reflex)  Result Value Ref Range   RBC / HPF 0-5 0 - 5 RBC/hpf   WBC, UA 6-10 0 - 5 WBC/hpf   Bacteria, UA MANY (A) NONE SEEN   Squamous Epithelial / HPF 6-10 0 - 5 /HPF   CT Renal Stone Study  Result Date: 03/09/2023 CLINICAL DATA:  Right lower quadrant pain EXAM: CT ABDOMEN AND PELVIS WITHOUT CONTRAST TECHNIQUE: Multidetector CT imaging of the abdomen and pelvis was performed following the standard protocol without IV contrast. RADIATION DOSE REDUCTION: This exam was performed according to the departmental dose-optimization program which includes automated exposure control, adjustment of the mA and/or kV according to patient size and/or use of iterative reconstruction technique. COMPARISON:  None Available. FINDINGS: Lower Chest:  Normal. Hepatobiliary: Normal hepatic contours. No intra- or extrahepatic biliary dilatation. The gallbladder is normal. Pancreas: Normal pancreas. No ductal dilatation or peripancreatic fluid collection. Spleen: Normal.  Adrenals/Urinary Tract: The adrenal glands are normal. No hydronephrosis, nephroureterolithiasis or solid renal mass. The urinary bladder is normal for degree of distention Stomach/Bowel: There is no hiatal hernia. Normal duodenal course and caliber. No small bowel dilatation or inflammation. No focal colonic abnormality. Large amount of stool in the cecum and ascending colon. Normal appendix. Vascular/Lymphatic: Normal course and caliber of the major abdominal vessels. No abdominal or pelvic lymphadenopathy. Reproductive: Normal uterus. No adnexal mass. Other: None. Musculoskeletal: No bony spinal canal stenosis or focal osseous abnormality. IMPRESSION: 1. No acute abnormality of the abdomen or pelvis. 2. Large amount of stool in the cecum and ascending colon. Electronically Signed   By: Ulyses Jarred M.D.   On: 03/09/2023 00:31    Radiology CT Renal Stone Study  Result Date: 03/09/2023 CLINICAL DATA:  Right lower quadrant pain EXAM: CT ABDOMEN AND PELVIS WITHOUT CONTRAST TECHNIQUE: Multidetector CT imaging of the abdomen and pelvis was performed following the standard protocol without IV contrast. RADIATION DOSE REDUCTION: This exam was performed according to the departmental dose-optimization program which includes automated exposure control, adjustment of the mA and/or kV according to patient size and/or use of iterative reconstruction technique. COMPARISON:  None Available. FINDINGS: Lower Chest: Normal. Hepatobiliary: Normal hepatic contours. No intra- or extrahepatic biliary dilatation. The gallbladder is normal. Pancreas: Normal pancreas. No ductal dilatation or peripancreatic fluid collection. Spleen: Normal. Adrenals/Urinary Tract: The adrenal glands are normal. No hydronephrosis,  nephroureterolithiasis or solid renal mass. The urinary bladder is normal for degree of distention Stomach/Bowel: There is no hiatal hernia. Normal duodenal course and caliber. No small bowel dilatation or inflammation. No focal colonic abnormality. Large amount of stool in the cecum and ascending colon. Normal appendix. Vascular/Lymphatic: Normal course and caliber of the major abdominal vessels. No abdominal or pelvic lymphadenopathy. Reproductive: Normal uterus. No adnexal mass. Other: None. Musculoskeletal: No bony spinal canal stenosis or focal osseous abnormality. IMPRESSION: 1. No acute abnormality of the abdomen or pelvis. 2. Large amount of stool in the cecum and ascending colon. Electronically Signed   By: Ulyses Jarred M.D.   On: 03/09/2023 00:31    Procedures Procedures    Medications Ordered in ED Medications  sulfamethoxazole-trimethoprim (BACTRIM DS) 800-160 MG per tablet 1 tablet (1 tablet Oral Given 03/09/23 0046)  naproxen (NAPROSYN) tablet 500 mg (500 mg Oral Given 03/09/23 0046)    ED Course/ Medical Decision Making/ A&P                             Medical Decision Making RLQ pain ongoing since being diagnosed with ruptured cyst at GYN  Problems Addressed: Acute cystitis without hematuria:    Details: Antibiotics initiated in the ed, will discharge with RX for antibiotics  Constipation, unspecified constipation type:    Details: Stool burden is right in the area of pain.  I have advised miralax one capful daily.    Amount and/or Complexity of Data Reviewed Independent Historian: spouse    Details: See above  External Data Reviewed: notes.    Details: Previous notes reviewed  Labs: ordered.    Details: All lbs reviewed" preg test is negative.  Urinalysis is positive for uti.  White count normal 4.8, normal hemoglobin 13.3, normal platelets.  Sodium slight low 131, normal potassium 3.9, normal creatinine .29 Radiology: ordered and independent interpretation  performed.    Details: Constipation on CT by me   Risk Prescription drug management. Risk Details: No free fluid on  CT.  Stool in that area.  Will trat for UTI.  Stable for discharge.  Strict return   Final Clinical Impression(s) / ED Diagnoses Final diagnoses:  Acute cystitis without hematuria  Constipation, unspecified constipation type   Return for intractable cough, coughing up blood, fevers > 100.4 unrelieved by medication, shortness of breath, intractable vomiting, chest pain, shortness of breath, weakness, numbness, changes in speech, facial asymmetry, abdominal pain, passing out, Inability to tolerate liquids or food, cough, altered mental status or any concerns. No signs of systemic illness or infection. The patient is nontoxic-appearing on exam and vital signs are within normal limits.  I have reviewed the triage vital signs and the nursing notes. Pertinent labs & imaging results that were available during my care of the patient were reviewed by me and considered in my medical decision making (see chart for details). After history, exam, and medical workup I feel the patient has been appropriately medically screened and is safe for discharge home. Pertinent diagnoses were discussed with the patient. Patient was given return precautions. Rx / DC Orders ED Discharge Orders          Ordered    sulfamethoxazole-trimethoprim (BACTRIM DS) 800-160 MG tablet  2 times daily        03/09/23 0044              Jacquelyn Antony, MD 03/09/23 0403

## 2023-03-09 NOTE — Discharge Instructions (Signed)
Miralax one capful a day for constipation

## 2023-03-11 IMAGING — US US PELVIS COMPLETE TRANSABD/TRANSVAG W DUPLEX
2 series · 13 of 25 positions shown · non-contrast
Comparison: None.

CLINICAL DATA: Left lower quadrant pain.

EXAM:
TRANSABDOMINAL AND TRANSVAGINAL ULTRASOUND OF PELVIS
DOPPLER ULTRASOUND OF OVARIES
TECHNIQUE: Both transabdominal and transvaginal ultrasound examinations of the
pelvis were performed. Transabdominal technique was performed for
global imaging of the pelvis including uterus, ovaries, adnexal
regions, and pelvic cul-de-sac.
It was necessary to proceed with endovaginal exam following the
transabdominal exam to visualize the endometrium. Color and duplex
Doppler ultrasound was utilized to evaluate blood flow to the
ovaries.

[Series 1: us pelvic complete w transvaginal and torsion righ · 12 of 63 slices shown]
[im 1/63]
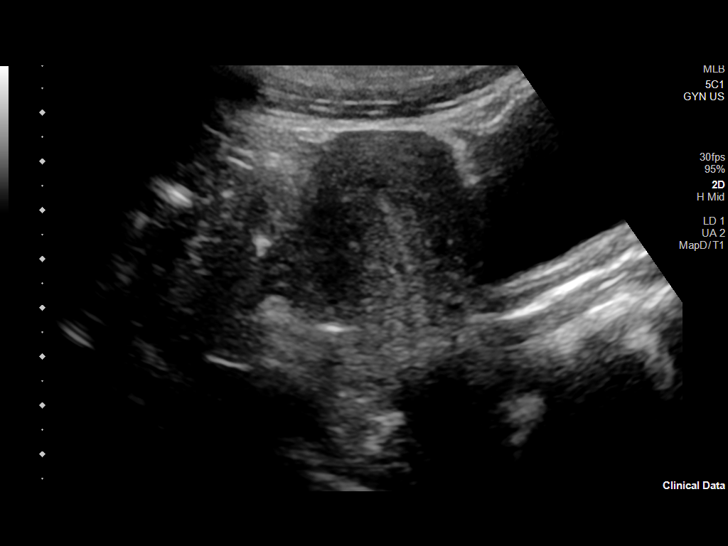
[im 6/63]
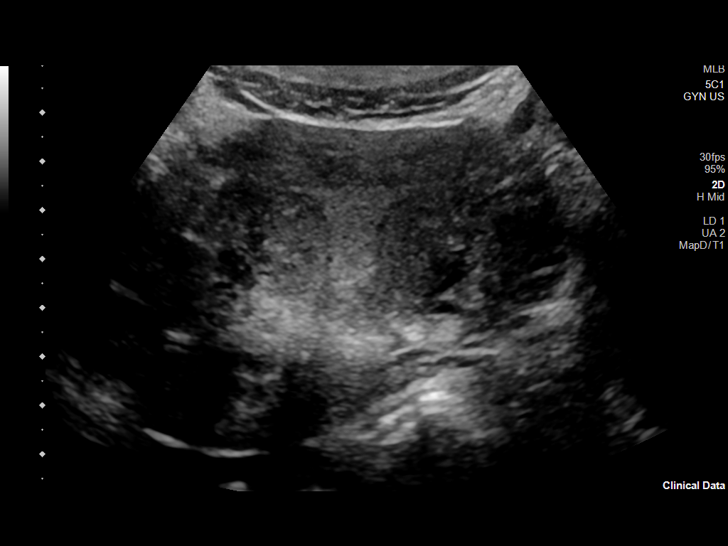
[im 11/63]
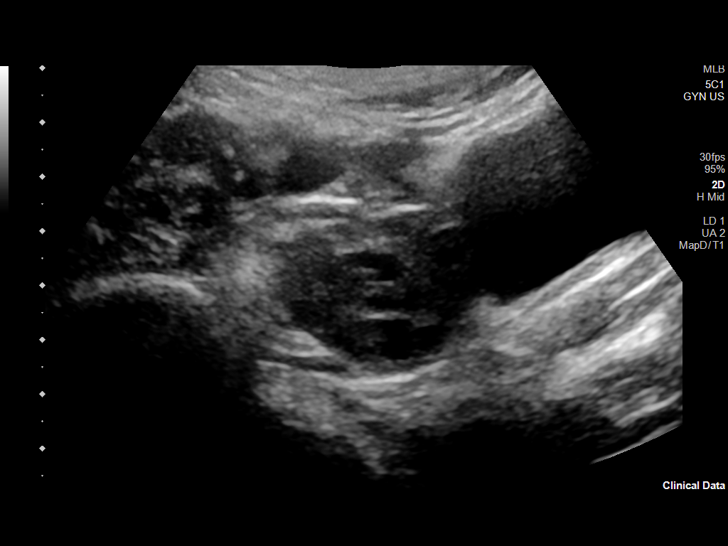
[im 17/63]
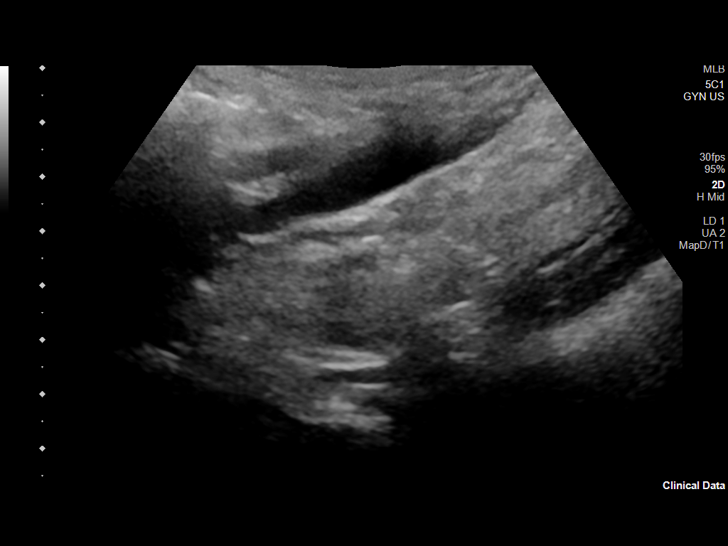
[im 22/63]
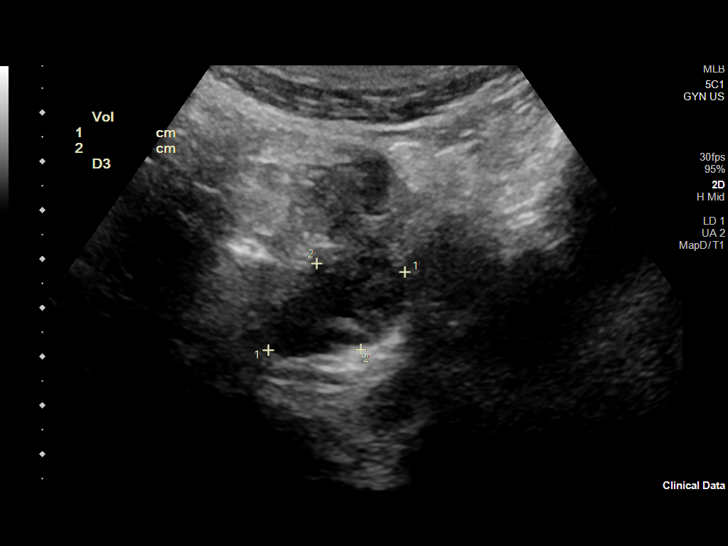
[im 27/63]
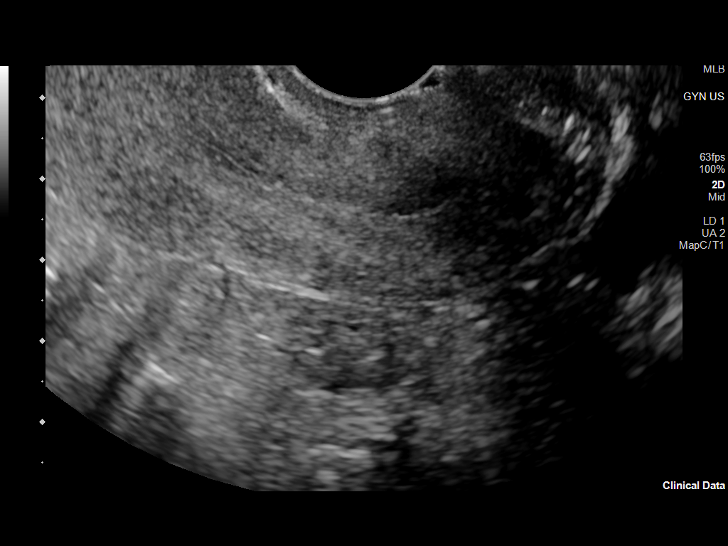
[im 33/63]
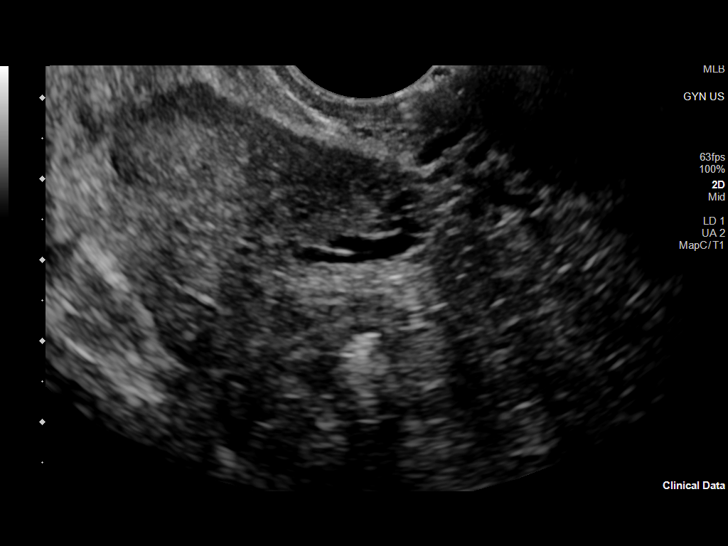
[im 38/63]
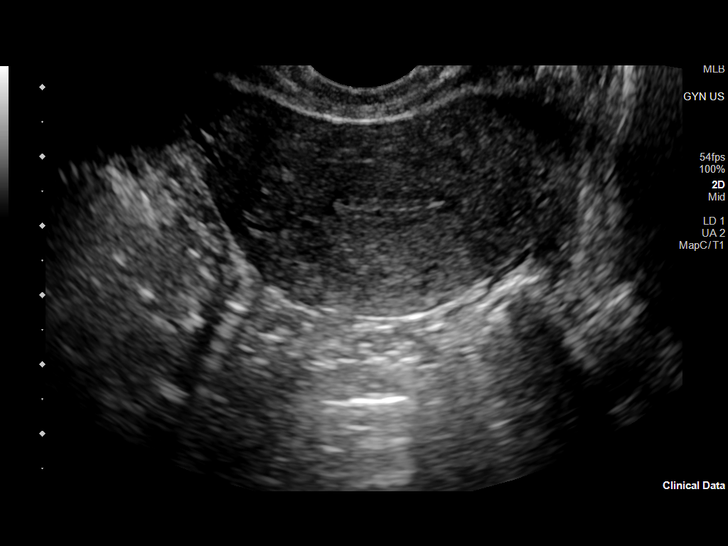
[im 44/63]
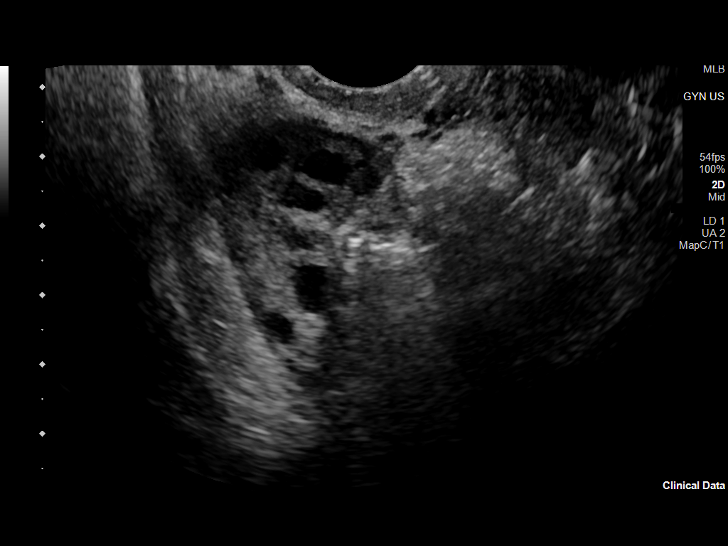
[im 49/63]
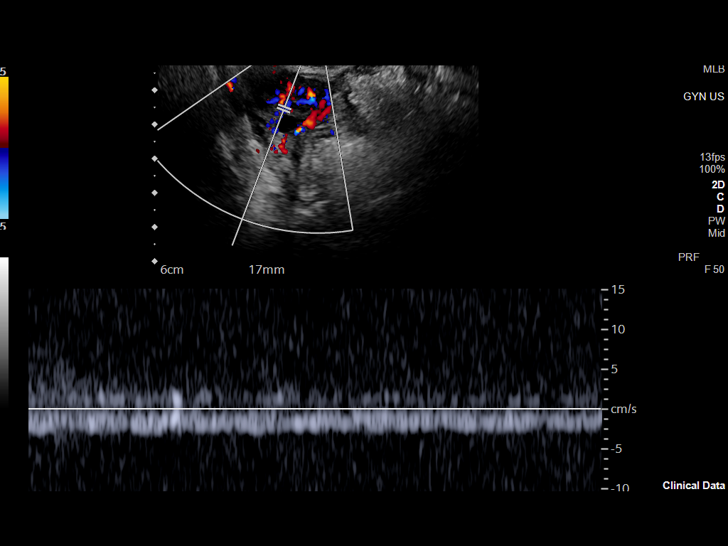
[im 54/63]
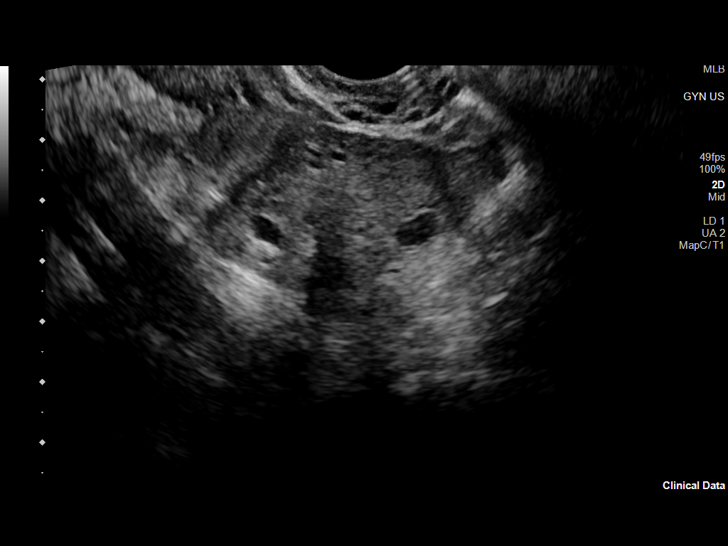
[im 60/63]
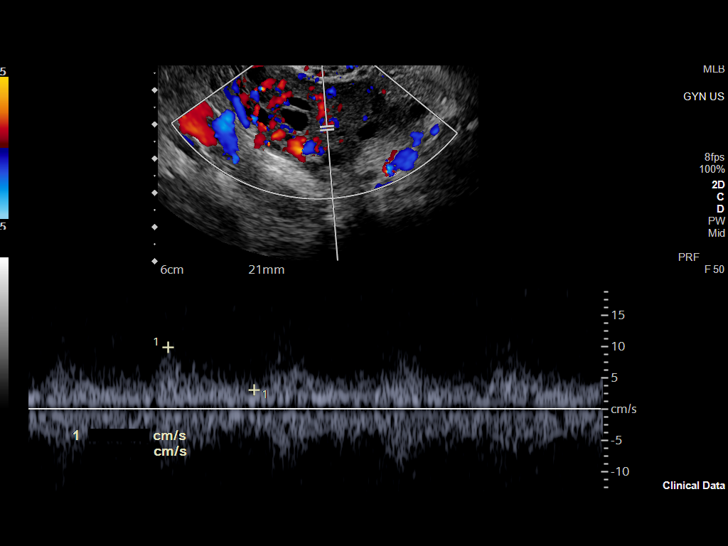

[Series 1001: gyn us · 1 of 3 slices shown]
[im 1/3]
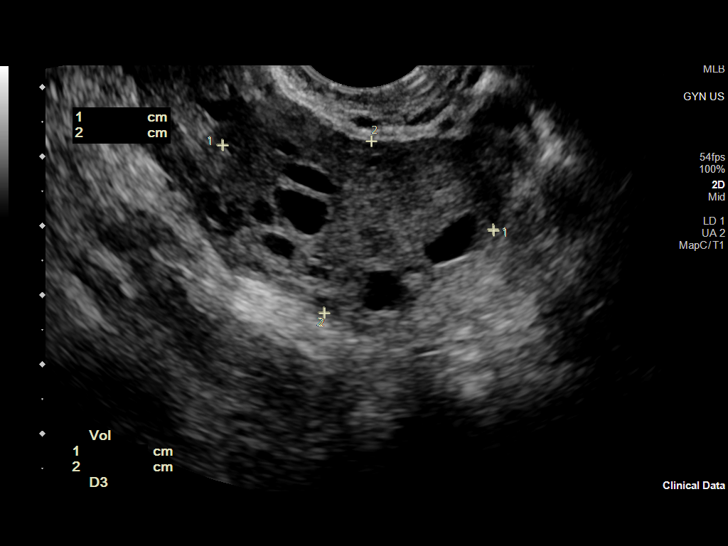

[13 of 25 positions shown; findings below may reference images not displayed]

FINDINGS: Uterus

Measurements: 7.8 cm x 3.0 cm x 4.8 cm = volume: 58.4 mL. No
fibroids or other mass visualized.

Endometrium

Thickness: 4.4 mm.  No focal abnormality visualized.

Right ovary

Measurements: 7.3 cm x 3.1 cm x 2.7 cm = volume: 10.1 mL. Numerous
subcentimeter anechoic structures are seen throughout the right
ovary. No abnormal flow is seen within these regions on color
Doppler evaluation.

Left ovary

Measurements: 4.1 cm x 2.6 cm x 2.3 cm = volume: 12.7 mL. Numerous
subcentimeter anechoic structures are seen throughout the left
ovary. No abnormal flow is seen within these regions on color
Doppler evaluation.

Pulsed Doppler evaluation of both ovaries demonstrates normal
low-resistance arterial and venous waveforms.

Other findings

No abnormal free fluid.
IMPRESSION: 1. Findings likely consistent with numerous subcentimeter bilateral
ovarian follicles. No additional follow-up imaging is recommended.
This recommendation follows the consensus statement: Management of
Asymptomatic Ovarian and Other Adnexal Cysts Imaged at US: Society
of Radiologists in Ultrasound Consensus Conference Statement.
2. Normal bilateral ovarian flow.

## 2023-03-31 ENCOUNTER — Other Ambulatory Visit: Payer: Self-pay

## 2023-03-31 ENCOUNTER — Emergency Department (HOSPITAL_BASED_OUTPATIENT_CLINIC_OR_DEPARTMENT_OTHER)
Admission: EM | Admit: 2023-03-31 | Discharge: 2023-03-31 | Disposition: A | Discharge status: Home / Self Care | Payer: 59 | Attending: Emergency Medicine | Admitting: Emergency Medicine

## 2023-03-31 ENCOUNTER — Emergency Department (HOSPITAL_BASED_OUTPATIENT_CLINIC_OR_DEPARTMENT_OTHER): Payer: 59

## 2023-03-31 ENCOUNTER — Encounter (HOSPITAL_BASED_OUTPATIENT_CLINIC_OR_DEPARTMENT_OTHER): Payer: Self-pay | Admitting: Pediatrics

## 2023-03-31 DIAGNOSIS — K589 Irritable bowel syndrome without diarrhea: Secondary | ICD-10-CM | POA: Insufficient documentation

## 2023-03-31 DIAGNOSIS — Z3A01 Less than 8 weeks gestation of pregnancy: Secondary | ICD-10-CM | POA: Insufficient documentation

## 2023-03-31 DIAGNOSIS — O26891 Other specified pregnancy related conditions, first trimester: Secondary | ICD-10-CM | POA: Diagnosis present

## 2023-03-31 DIAGNOSIS — O0001 Abdominal pregnancy with intrauterine pregnancy: Secondary | ICD-10-CM | POA: Insufficient documentation

## 2023-03-31 DIAGNOSIS — R1031 Right lower quadrant pain: Secondary | ICD-10-CM

## 2023-03-31 DIAGNOSIS — Z3490 Encounter for supervision of normal pregnancy, unspecified, unspecified trimester: Secondary | ICD-10-CM

## 2023-03-31 DIAGNOSIS — K5900 Constipation, unspecified: Secondary | ICD-10-CM | POA: Insufficient documentation

## 2023-03-31 LAB — URINALYSIS, ROUTINE W REFLEX MICROSCOPIC
Bilirubin Urine: NEGATIVE
Glucose, UA: NEGATIVE mg/dL
Hgb urine dipstick: NEGATIVE
Ketones, ur: NEGATIVE mg/dL
Leukocytes,Ua: NEGATIVE
Nitrite: NEGATIVE
Protein, ur: NEGATIVE mg/dL
Specific Gravity, Urine: 1.015 (ref 1.005–1.030)
pH: 7.5 (ref 5.0–8.0)

## 2023-03-31 LAB — ABO/RH
ABO/RH(D): O NEG
Antibody Screen: NEGATIVE

## 2023-03-31 LAB — HCG, QUANTITATIVE, PREGNANCY: hCG, Beta Chain, Quant, S: 1230 m[IU]/mL — ABNORMAL HIGH (ref <5)

## 2023-03-31 LAB — PREGNANCY, URINE: Preg Test, Ur: POSITIVE — AB

## 2023-03-31 NOTE — ED Triage Notes (Signed)
Reports 1st pregnancy at approx 6 weeks, EDD: 11/25/2023. C/O of RLQ abdominal pain started at 0400 this am; denies urgency to push, denies bleeding but has some brown discharge. Denies painful urination.

## 2023-03-31 NOTE — Discharge Instructions (Signed)
Thank you for coming to [facility]. You were seen for abdominal pain in early pregnancy. We did an exam, labs, and imaging, and these showed a single intrauterine gestational sac measuring 4-5 weeks in age.  Please follow up with your OB as scheduled on Wednesday. It is likely that your pain is a result of ovarian cysts. Your pain improved throughout your stay in the ED. However, we did not evaluate your appendix. If your pain worsens or does not improve, please return to the ED for further workup.   Do not hesitate to return to the ED or call 911 if you experience: -Worsening symptoms -Nausea/vomiting -Vaginal bleeding or gush of fluid -Lightheadedness, passing out -Fevers/chills -Anything else that concerns you

## 2023-03-31 NOTE — ED Provider Notes (Signed)
[facility] Provider Note   CSN: [id number] Arrival date & time: 03/31/23  1759     History  Chief Complaint  Patient presents with   Abdominal Pain in Pregnancy    Gabrielle Bryant is a 32 y.o. female G1P0 pregnancy complicated by IBS, OCD, PCOS, thrombocytopenia, anxiety/depression, h/o eating disorder who presents with abd pain in pregnancy.   Reports 1st pregnancy at approx 6 weeks, EDD: 11/25/2023. C/O of RLQ abdominal pain started at 0400 this am; denies urgency to push, denies bleeding but has some brown discharge. Denies urinary symptoms, passage of tissue/clots, nausea/vomiting/diarrhea/constipation, fevers/chills, chest pain/SOB, leg swelling. Has been trying to get pregnant for a very long time.   HPI     Home Medications Prior to Admission medications   Not on File      Allergies    Nitrofurantoin and Penicillins    Review of Systems   Review of Systems Review of systems Negative for f/c.  A 10 point review of systems was performed and is negative unless otherwise reported in HPI.  Physical Exam Updated Vital Signs BP 119/83 (BP Location: Right Arm)   Pulse 94   Temp 98.4 F (36.9 C) (Oral)   Resp 14   Ht 5\' 5"  (1.651 m)   Wt 47.6 kg   LMP 02/18/2023   SpO2 97%   BMI 17.47 kg/m  Physical Exam General: Normal appearing female, lying in bed.  HEENT: PERRLA, Sclera anicteric, MMM, trachea midline.  Cardiology: RRR, no murmurs/rubs/gallops. BL radial and DP pulses equal bilaterally.  Resp: Normal respiratory rate and effort. CTAB, no wheezes, rhonchi, crackles.  Abd: Mild R adnexal TTP. Soft, non-distended. No rebound tenderness or guarding.  GU: Deferred. MSK: No peripheral edema or signs of trauma. Extremities without deformity or TTP. No cyanosis or clubbing. Skin: warm, dry. No rashes or lesions. Back: No CVA tenderness Neuro: A&Ox4, CNs II-XII grossly intact. MAEs. Sensation grossly intact.  Psych:  Normal mood and affect.   ED Results / Procedures / Treatments   Labs (all labs ordered are listed, but only abnormal results are displayed) Labs Reviewed  PREGNANCY, URINE - Abnormal; Notable for the following components:      Result Value   Preg Test, Ur POSITIVE (*)    All other components within normal limits  HCG, QUANTITATIVE, PREGNANCY - Abnormal; Notable for the following components:   hCG, Beta Chain, Quant, S 1,230 (*)    All other components within normal limits  URINALYSIS, ROUTINE W REFLEX MICROSCOPIC  ABO/RH    EKG None  Radiology No results found.  Procedures Procedures    Medications Ordered in ED Medications - No data to display  ED Course/ Medical Decision Making/ A&P                          Medical Decision Making Amount and/or Complexity of Data Reviewed Labs: ordered. Decision-making details documented in ED Course. Radiology:  Decision-making details documented in ED Course.    This patient presents to the ED for concern of abd pain in pregnancy, this involves an extensive number of treatment options, and is a complaint that carries with it a high risk of complications and morbidity. Afebrile, HDS, overall well and non-toxic appearing.  MDM:    For abdominal pain in pregnancy <20 wks, consider:  Complete vs incomplete vs inevitable vs missed vs threatened abortion. Also consider ectopic pregnancy - patient has had only some  brown vaginal spotting, less likely abortion given lack of vaginal bleeding but will evaluate with ultrasound. No vaginal discharge to indicate PID, and pregnancy is protective against PID.  Consider non-obstetric causes of abd pain such as appendicitis, patient's pain is adnexal on exam, appears to be in pelvis, but must consider. NO RUQ TTP to indicate hepatobiliary disease. No LLQ TTP to indicate diverticulitis. No urinary symptoms or flank pain to indicate cystitis/pyelonephritis.    Clinical Course as of 04/12/23 1023   Sat Mar 31, 2023  2000 HCG, Beta Chain, Quant, Kathie RhodesS(!): 1,230 [HN]  2055 US OB LESS THAN 14 WEEKS WITH OB TRANSVAGINAL 5 week intrauterine gestational sac. [facility]. No adnexal pathology.   [HN]  2226 ABO/RH(D): O NEG Patient informed that she will need Rhogam if she ever has vaginal bleeding or during her delivery. [HN]    Clinical Course User Index [HN] Loetta RoughNaasz, Brittnay Pigman N, MD    Labs: I Ordered, and personally interpreted labs.  The pertinent results include:  those listed above  Imaging Studies ordered: I ordered imaging studies including Pelvic US I independently visualized and interpreted imaging. I agree with the radiologist interpretation  Additional history obtained from chart review, husband at bedside.    Reevaluation: After the interventions noted above, I reevaluated the patient and found that they have :improved  Social Determinants of Health: Lives with her husband  Disposition:  Discussed with patient. Her abd pain has improved since arrival to ED. She is very relieved that the pregnancy is intrauterine and appears okay. She has no vaginal bleeding, did have some spotting earlier, but less likely threatened abortion. Unclear cause of abd pain. I discussed that HR could be seen, likely too early, and HCG appears c/w dates. Discussed that we did not evaluate for appendicitis, that less likely but if her symptoms do not improve, she develops fever/chills/N/V to return to ED. Patient agrees and would like to avoid CT scan if possible. Instructed to f/u with OB. Patient/husband report understanding. DC w/ discharge instructions/return precautions. All questions answered to patient's satisfaction.    Co morbidities that complicate the patient evaluation  Past Medical History:  Diagnosis Date   Anxiety    Avoidant-restrictive food intake disorder (ARFID)    Depression    OCD (obsessive compulsive disorder)    Ovarian cyst      Medicines No orders of  the defined types were placed in this encounter.   I have reviewed the patients home medicines and have made adjustments as needed  Problem List / ED Course: Problem List Items Addressed This Visit   None Visit Diagnoses     Intrauterine pregnancy    -  Primary   Right lower quadrant abdominal pain                       This note was created using dictation software, which may contain spelling or grammatical errors.    Loetta RoughNaasz, Riven Beebe N, MD 04/12/23 1028

## 2023-03-31 NOTE — ED Notes (Signed)
Discharge instructions provided by edp were discussed with pt. Pt verbalized understanding with no additional questions at this time. Endorses understanding of s/s to return to er and follow up with obgyn on Wednesday. Pt to go home with s/o at bedside.

## 2023-04-07 ENCOUNTER — Other Ambulatory Visit: Payer: Self-pay

## 2023-04-07 ENCOUNTER — Encounter (HOSPITAL_BASED_OUTPATIENT_CLINIC_OR_DEPARTMENT_OTHER): Payer: Self-pay | Admitting: *Deleted

## 2023-04-07 ENCOUNTER — Emergency Department (HOSPITAL_BASED_OUTPATIENT_CLINIC_OR_DEPARTMENT_OTHER)
Admission: EM | Admit: 2023-04-07 | Discharge: 2023-04-07 | Disposition: A | Discharge status: Home / Self Care | Payer: 59 | Attending: Emergency Medicine | Admitting: Emergency Medicine

## 2023-04-07 DIAGNOSIS — O034 Incomplete spontaneous abortion without complication: Secondary | ICD-10-CM | POA: Insufficient documentation

## 2023-04-07 DIAGNOSIS — O200 Threatened abortion: Secondary | ICD-10-CM | POA: Diagnosis present

## 2023-04-07 LAB — HCG, QUANTITATIVE, PREGNANCY: hCG, Beta Chain, Quant, S: 1449 m[IU]/mL — ABNORMAL HIGH (ref <5)

## 2023-04-07 NOTE — Discharge Instructions (Signed)
Continue follow-up with her OB.  It is important for them to continue to trend your pregnancy hormone.  Please return if you develop severe crushing abdominal pain or other concerning symptoms as discussed.

## 2023-04-07 NOTE — ED Triage Notes (Signed)
Pt with LMP 2/25 and EDD 11/25/23 is here for lower abdominal cramping and bleeding which began yesterday when she reports cramping and spotting and the bleeding increased and she has been passing blood clots today.

## 2023-04-07 NOTE — ED Provider Notes (Signed)
[facility] Provider Note   CSN: [id number] Arrival date & time: 04/07/23  1523     History  Chief Complaint  Patient presents with   Threatened Miscarriage    Gabrielle Bryant is a 32 y.o. female.  Patient is here for further evaluation of what she suspects is a miscarriage.  She has been having now some bleeding and a little bit heavier than her menstrual cycle here the last 2 days.  She has had 2 ultrasounds done.  She is not having any abdominal pain.  She does not have any nausea or vomiting or chest pain or shortness of breath.  She denies any weakness or numbness or lightheadedness.  No syncope episodes.  The history is provided by the patient.       Home Medications Prior to Admission medications   Medication Sig Start Date End Date Taking? Authorizing Provider  meclizine (ANTIVERT) 25 MG tablet Take 1 tablet (25 mg total) by mouth 3 (three) times daily as needed for dizziness. 10/05/21   Geoffery Lyons, MD      Allergies    Nitrofurantoin and Penicillins    Review of Systems   Review of Systems  Physical Exam Updated Vital Signs BP 111/85 (BP Location: Left Arm)   Pulse (!) 101   Resp 16   LMP 02/18/2023   SpO2 100%  Physical Exam Vitals and nursing note reviewed.  Constitutional:      General: She is not in acute distress.    Appearance: She is well-developed.  HENT:     Head: Normocephalic and atraumatic.  Eyes:     Conjunctiva/sclera: Conjunctivae normal.     Pupils: Pupils are equal, round, and reactive to light.  Cardiovascular:     Rate and Rhythm: Normal rate and regular rhythm.     Pulses: Normal pulses.     Heart sounds: No murmur heard. Pulmonary:     Effort: Pulmonary effort is normal. No respiratory distress.     Breath sounds: Normal breath sounds.  Abdominal:     Palpations: Abdomen is soft.     Tenderness: There is no abdominal tenderness.  Musculoskeletal:        General: No swelling.      Cervical back: Neck supple.  Skin:    General: Skin is warm and dry.     Capillary Refill: Capillary refill takes less than 2 seconds.  Neurological:     Mental Status: She is alert.  Psychiatric:        Mood and Affect: Mood normal.     ED Results / Procedures / Treatments   Labs (all labs ordered are listed, but only abnormal results are displayed) Labs Reviewed  HCG, QUANTITATIVE, PREGNANCY - Abnormal; Notable for the following components:      Result Value   hCG, Beta Chain, Quant, S 1,449 (*)    All other components within normal limits    EKG None  Radiology No results found.  Procedures Procedures    Medications Ordered in ED Medications - No data to display  ED Course/ Medical Decision Making/ A&P                             Medical Decision Making Amount and/or Complexity of Data Reviewed Labs: ordered.   Gabrielle Bryant is here with vaginal bleeding.  She is about [redacted] weeks pregnant.  She was seen last week and had ultrasound  that showed gestational sac but no yolk sac or fetal pole.  She had an ultrasound 2 days ago at her OB doctor's office that also showed the same.  No evidence of ectopic pregnancy.  She had a pregnancy hormone last week that was 1200.  Today it is 1500.  She not having any abdominal pain currently.  She is having vaginal bleeding typical of her menses.  Overall suspect that this is an incomplete miscarriage.  I talked with Dr. Macon Large and overall she recommends that patient continue outpatient follow-up with her OB doctor to trend her hCG down to 0.  No need for further imaging at this time or workup.  Patient is Rh- but no need for RhoGAM at this time.  Patient understands return precautions.  Discharged in good condition.  She appears very comfortable.  Have no concern for ectopic pregnancy.  This chart was dictated using voice recognition software.  Despite best efforts to proofread,  errors can occur which can change the documentation  meaning.         Final Clinical Impression(s) / ED Diagnoses Final diagnoses:  Incomplete miscarriage    Rx / DC Orders ED Discharge Orders     None         Virgina Norfolk, DO 04/07/23 1743

## 2023-04-09 ENCOUNTER — Other Ambulatory Visit: Payer: Self-pay

## 2023-04-09 ENCOUNTER — Encounter (HOSPITAL_BASED_OUTPATIENT_CLINIC_OR_DEPARTMENT_OTHER): Payer: Self-pay | Admitting: Emergency Medicine

## 2023-04-09 DIAGNOSIS — O26891 Other specified pregnancy related conditions, first trimester: Secondary | ICD-10-CM | POA: Diagnosis present

## 2023-04-09 DIAGNOSIS — Z23 Encounter for immunization: Secondary | ICD-10-CM | POA: Diagnosis not present

## 2023-04-09 DIAGNOSIS — O039 Complete or unspecified spontaneous abortion without complication: Secondary | ICD-10-CM | POA: Insufficient documentation

## 2023-04-09 DIAGNOSIS — Z2913 Encounter for prophylactic Rho(D) immune globulin: Secondary | ICD-10-CM | POA: Diagnosis not present

## 2023-04-09 LAB — CBC
HCT: 37.4 % (ref 36.0–46.0)
Hemoglobin: 12.6 g/dL (ref 12.0–15.0)
MCH: 29.9 pg (ref 26.0–34.0)
MCHC: 33.7 g/dL (ref 30.0–36.0)
MCV: 88.8 fL (ref 80.0–100.0)
Platelets: 176 10*3/uL (ref 150–400)
RBC: 4.21 MIL/uL (ref 3.87–5.11)
RDW: 11.9 % (ref 11.5–15.5)
WBC: 5.2 10*3/uL (ref 4.0–10.5)
nRBC: 0 % (ref 0.0–0.2)

## 2023-04-09 LAB — HCG, QUANTITATIVE, PREGNANCY: hCG, Beta Chain, Quant, S: 255 m[IU]/mL — ABNORMAL HIGH (ref <5)

## 2023-04-09 NOTE — ED Triage Notes (Signed)
Increased abdo cramping and continued bleeding. Started bleeding and cramping on 04/07/23, miscarriage.

## 2023-04-10 ENCOUNTER — Emergency Department (HOSPITAL_BASED_OUTPATIENT_CLINIC_OR_DEPARTMENT_OTHER)
Admission: EM | Admit: 2023-04-10 | Discharge: 2023-04-10 | Disposition: A | Discharge status: Home / Self Care | Payer: 59 | Attending: Emergency Medicine | Admitting: Emergency Medicine

## 2023-04-10 ENCOUNTER — Emergency Department (HOSPITAL_BASED_OUTPATIENT_CLINIC_OR_DEPARTMENT_OTHER): Payer: 59

## 2023-04-10 DIAGNOSIS — Z2913 Encounter for prophylactic Rho(D) immune globulin: Secondary | ICD-10-CM

## 2023-04-10 DIAGNOSIS — O039 Complete or unspecified spontaneous abortion without complication: Secondary | ICD-10-CM

## 2023-04-10 MED ORDER — RHO D IMMUNE GLOBULIN 1500 UNIT/2ML IJ SOSY
300.0000 ug | PREFILLED_SYRINGE | Freq: Once | INTRAMUSCULAR | Status: AC
  Administered 2023-04-10: 300 ug via INTRAMUSCULAR
  Filled 2023-04-10: qty 2

## 2023-04-10 NOTE — ED Notes (Signed)
Out to US

## 2023-04-10 NOTE — ED Provider Notes (Signed)
[facility] Provider Note: Lowella Dell, MD, FACEP  CSN: [id number] MRN: [id number] ARRIVAL: 04/09/23 at 2235 ROOM: DB013/DB013   CHIEF COMPLAINT  Abdominal Pain   HISTORY OF PRESENT ILLNESS  04/10/23 12:18 AM Gabrielle Bryant is a 32 y.o. female who was seen on 04/07/2023 for vaginal bleeding at about 5 weeks of pregnancy.  She was diagnosed with incomplete miscarriage.  Her quantitative beta-hCG was 1449, having been 12 3010 days earlier.  No imaging was done at that visit.  She returns with increased abdominal Cramping and continued bleeding.  She rates her pain as a 6 out of 10 and localizes it to the pelvic region.     Past Medical History:  Diagnosis Date   Anxiety    Avoidant-restrictive food intake disorder (ARFID)    Depression    OCD (obsessive compulsive disorder)    Ovarian cyst     Past Surgical History:  Procedure Laterality Date   WISDOM TOOTH EXTRACTION Bilateral     History reviewed. No pertinent family history.  Social History   Tobacco Use   Smoking status: Never   Smokeless tobacco: Never  Substance Use Topics   Alcohol use: Not Currently   Drug use: Not Currently    Prior to Admission medications   Not on File    Allergies Nitrofurantoin and Penicillins   REVIEW OF SYSTEMS  Negative except as noted here or in the History of Present Illness.   PHYSICAL EXAMINATION  Initial Vital Signs Blood pressure 114/88, pulse 85, temperature 97.7 F (36.5 C), resp. rate 18, last menstrual period 02/18/2023, SpO2 100 %.  Examination General: Well-developed, well-nourished female in no acute distress; appearance consistent with age of record HENT: normocephalic; atraumatic Eyes: pupils equal, round and reactive to light; extraocular muscles intact Neck: supple Heart: regular rate and rhythm; no murmurs, rubs or gallops Lungs: clear to auscultation bilaterally Abdomen: soft; nondistended; nontender; no masses or hepatosplenomegaly; bowel  sounds present Extremities: No deformity; full range of motion; pulses normal Neurologic: Awake, alert and oriented; motor function intact in all extremities and symmetric; no facial droop Skin: Warm and dry Psychiatric: Normal mood and affect   RESULTS  Summary of this visit's results, reviewed and interpreted by myself:   EKG Interpretation  Date/Time:    Ventricular Rate:    PR Interval:    QRS Duration:   QT Interval:    QTC Calculation:   R Axis:     Text Interpretation:         Laboratory Studies: Results for orders placed or performed during the hospital encounter of 04/10/23 (from the past 24 hour(s))  CBC     Status: None   Collection Time: 04/09/23 10:59 PM  Result Value Ref Range   WBC 5.2 4.0 - 10.5 K/uL   RBC 4.21 3.87 - 5.11 MIL/uL   Hemoglobin 12.6 12.0 - 15.0 g/dL   HCT 29.5 28.4 - 13.2 %   MCV 88.8 80.0 - 100.0 fL   MCH 29.9 26.0 - 34.0 pg   MCHC 33.7 30.0 - 36.0 g/dL   RDW 44.0 10.2 - 72.5 %   Platelets 176 150 - 400 K/uL   nRBC 0.0 0.0 - 0.2 %  hCG, quantitative, pregnancy     Status: Abnormal   Collection Time: 04/09/23 10:59 PM  Result Value Ref Range   hCG, Beta Chain, Quant, S 255 (H) <5 mIU/mL   Imaging Studies: US OB LESS THAN 14 WEEKS WITH OB TRANSVAGINAL  Result Date: 04/10/2023  CLINICAL DATA:  Pregnant, vaginal bleeding, miscarriage on 04/13 with declining beta HCG (255) EXAM: OBSTETRIC <14 WK [place] AND TRANSVAGINAL OB US TECHNIQUE: Both transabdominal and transvaginal ultrasound examinations were performed for complete evaluation of the gestation as well as the maternal uterus, adnexal regions, and pelvic cul-de-sac. Transvaginal technique was performed to assess early pregnancy. COMPARISON:  03/31/2023 FINDINGS: Intrauterine gestational sac: None Maternal uterus/adnexae: Endometrial complex measures 5 mm. Bilateral ovaries are within normal limits, noting an involuting corpus luteum on the right. No free fluid. IMPRESSION: No IUP is  visualized. This appearance is compatible with the patient's clinical history of early pregnancy loss. No findings suspicious for RPOC. Electronically Signed   By: Charline Bills M.D.   On: 04/10/2023 00:33    ED COURSE and MDM  Nursing notes, initial and subsequent vitals signs, including pulse oximetry, reviewed and interpreted by myself.  Vitals:   04/09/23 2254  BP: 114/88  Pulse: 85  Resp: 18  Temp: 97.7 F (36.5 C)  SpO2: 100%   Medications  rho (d) immune globulin (RHIG/RHOPHYLAC) injection 300 mcg (has no administration in time range)    Ultrasound consistent with a completed miscarriage.  No evidence of retained products of conception.  The patient is Rh- and we will administer dose of Rhophylac.  She was advised to return should bleeding worsen or should she develop signs of infection such as worsening pain or fever.  PROCEDURES  Procedures   ED DIAGNOSES     ICD-10-CM   1. Complete miscarriage  O03.9     2. Encounter for prophylactic Rho(d) immune globulin  Z29.13          Gabrielle Schumm, MD 04/10/23 [phone]

## 2023-04-10 NOTE — ED Notes (Signed)
Observed post RhoGam admin. No reactions noted. Pt verbalized understanding of d/c instructions, meds, and followup care. Denies questions. VSS, no distress noted. Steady gait to exit with all belongings.

## 2024-10-24 ENCOUNTER — Encounter: Payer: Self-pay | Admitting: Gastroenterology

## 2024-12-11 ENCOUNTER — Ambulatory Visit: Admitting: Gastroenterology

## 2025-01-15 ENCOUNTER — Encounter: Payer: Self-pay | Admitting: Gastroenterology

## 2025-01-15 ENCOUNTER — Ambulatory Visit: Admitting: Gastroenterology

## 2025-01-15 VITALS — BP 96/64 | HR 74 | Ht 65.0 in | Wt 102.0 lb

## 2025-01-15 DIAGNOSIS — K581 Irritable bowel syndrome with constipation: Secondary | ICD-10-CM

## 2025-01-15 DIAGNOSIS — K5909 Other constipation: Secondary | ICD-10-CM

## 2025-01-15 DIAGNOSIS — F429 Obsessive-compulsive disorder, unspecified: Secondary | ICD-10-CM

## 2025-01-15 DIAGNOSIS — R1030 Lower abdominal pain, unspecified: Secondary | ICD-10-CM

## 2025-01-15 DIAGNOSIS — K5900 Constipation, unspecified: Secondary | ICD-10-CM

## 2025-01-15 DIAGNOSIS — F5082 Avoidant/restrictive food intake disorder: Secondary | ICD-10-CM

## 2025-01-15 DIAGNOSIS — F419 Anxiety disorder, unspecified: Secondary | ICD-10-CM

## 2025-01-15 MED ORDER — HYOSCYAMINE SULFATE 0.125 MG SL SUBL: 0.1250 mg | SUBLINGUAL_TABLET | Freq: Three times a day (TID) | SUBLINGUAL | 0 refills | Status: AC

## 2025-01-15 NOTE — Patient Instructions (Addendum)
 A high fiber diet with plenty of fluids (up to 8 glasses of water daily) is suggested to relieve these symptoms.  Benefiber, 1 tablespoon once or twice daily can be used to keep bowels regular if needed.  Start taking Miralax 1 capful (17 grams) 1x / day for 1 week.   If this is not effective, increase to 1 dose 2x / day for 1 week.   If this is still not effective, increase to two capfuls (34 grams) 2x / day.   Can adjust dose as needed based on response. Can take 1/2 cap daily, skip days, or increase per day.    We have sent the following medications to your pharmacy for you to pick up at your convenience:  Hyoscyamine   _______________________________________________________  If your blood pressure at your visit was 140/90 or greater, please contact your primary care physician to follow up on this.  _______________________________________________________  If you are age 32 or older, your body mass index should be between 23-30. Your Body mass index is 16.97 kg/m. If this is out of the aforementioned range listed, please consider follow up with your Primary Care Provider.  If you are age 32 or younger, your body mass index should be between 19-25. Your Body mass index is 16.97 kg/m. If this is out of the aformentioned range listed, please consider follow up with your Primary Care Provider.   ________________________________________________________  The [facility] providers would like to encourage you to use MYCHART to communicate with providers for non-urgent requests or questions.  Due to long hold times on the telephone, sending your provider a message by [facility] may be a faster and more efficient way to get a response.  Please allow 48 business hours for a response.  Please remember that this is for non-urgent requests.  _______________________________________________________  [facility] is using a team-based approach to care.  Your team is made up of your doctor and two  to three APPS. Our APPS (Nurse Practitioners and Physician Assistants) work with your physician to ensure care continuity for you. They are fully qualified to address your health concerns and develop a treatment plan. They communicate directly with your gastroenterologist to care for you. Seeing the Advanced Practice Practitioners on your physician's team can help you by facilitating care more promptly, often allowing for earlier appointments, access to diagnostic testing, procedures, and other specialty referrals.

## 2025-01-15 NOTE — Progress Notes (Addendum)
 "  Chief Complaint: Chronic abdominal pain Primary GI MD: Sampson  HPI: Discussed the use of AI scribe software for clinical note transcription with the patient, who gave verbal consent to proceed.  History of Present Illness  Gabrielle Bryant is a 34 year old female with chronic abdominal pain and constipation who presents for evaluation of persistent daily abdominal pain and altered bowel habits.  For approximately six years, she has experienced daily abdominal pain that limits her ability to eat a variety of foods.  Left-sided or lower abdomen described as constant with intermittent episodes of sharp pain severe enough to prevent standing and causing her to curl up. Pain does not improve with bowel movements and is sometimes worse afterwards.  Constipation is longstanding, characterized by infrequent, hard stools and periods of several days without a bowel movement. At other times, she has up to five bowel movements in a single day, which leaves her feeling miserable.  chronic constipation dates back to childhood, when she would avoid responding to the urge to defecate. She is not currently using any bowel regimen, fiber supplement, or laxative.  Significant nausea is present and extremely distressing, though she actively avoids vomiting and has not vomited. No blood or black coloration in her stools.  She previously underwent surgical removal of endometriosis, but abdominal pain persisted. She has been told her symptoms may be due to anxiety or endometriosis, but neither has explained her current symptoms. She gave birth approximately one year ago and does not feel her bowel symptoms worsened after childbirth.  A prior CT scan demonstrated a large amount of retained stool, with stool backed up to the left side of the colon. Recent laboratory studies, including thyroid function, were normal. She does not drink coffee but has ginger tea daily.    Past Medical History:  Diagnosis Date    Anxiety    Avoidant-restrictive food intake disorder (ARFID)    Depression    OCD (obsessive compulsive disorder)    Ovarian cyst     Past Surgical History:  Procedure Laterality Date   WISDOM TOOTH EXTRACTION Bilateral     Current Outpatient Medications  Medication Sig Dispense Refill   hyoscyamine  (LEVSIN  SL) 0.125 MG SL tablet Place 1 tablet (0.125 mg total) under the tongue in the morning, at noon, and at bedtime. 90 tablet 0   cholecalciferol (VITAMIN D3) 25 MCG (1000 UNIT) tablet Take 1,000 Units by mouth daily.     No current facility-administered medications for this visit.    Allergies as of 01/15/2025 - Review Complete 01/15/2025  Allergen Reaction Noted   Nitrofurantoin Rash 10/19/2021   Penicillins  05/22/2021    Family History  Problem Relation Age of Onset   Healthy Mother    Healthy Father     Social History   Socioeconomic History   Marital status: Married    Spouse name: Not on file   Number of children: 1   Years of education: Not on file   Highest education level: Not on file  Occupational History   Occupation: home maker  Tobacco Use   Smoking status: Never   Smokeless tobacco: Never  Vaping Use   Vaping status: Never Used  Substance and Sexual Activity   Alcohol use: Not Currently   Drug use: Not Currently   Sexual activity: Not Currently  Other Topics Concern   Not on file  Social History Narrative   Not on file   Social Drivers of Health   Tobacco Use: Low Risk (  01/15/2025)   Patient History    Smoking Tobacco Use: Never    Smokeless Tobacco Use: Never    Passive Exposure: Not on file  Financial Resource Strain: Medium Risk (10/03/2024)   Received from [facility]   Overall Financial Resource Strain (CARDIA)    How hard is it for you to pay for the very basics like food, housing, medical care, and heating?: Somewhat hard  Food Insecurity: No Food Insecurity (11/24/2024)   Received from [facility]   Epic    Within the past  12 months, you worried that your food would run out before you got the money to buy more.: Never true    Within the past 12 months, the food you bought just didn't last and you didn't have money to get more.: Never true  Transportation Needs: No Transportation Needs (11/24/2024)   Received from [facility]    In the past 12 months, has lack of transportation kept you from medical appointments or from getting medications?: No    In the past 12 months, has lack of transportation kept you from meetings, work, or from getting things needed for daily living?: No  Physical Activity: Sufficiently Active (10/03/2024)   Received from [facility]   Exercise Vital Sign    On average, how many days per week do you engage in moderate to strenuous exercise (like a brisk walk)?: 5 days    On average, how many minutes do you engage in exercise at this level?: 60 min  Stress: No Stress Concern Present (11/24/2024)   Received from [facility] of Occupational Health - Occupational Stress Questionnaire    Do you feel stress - tense, restless, nervous, or anxious, or unable to sleep at night because your mind is troubled all the time - these days?: Only a little  Social Connections: Moderately Integrated (10/03/2024)   Received from [facility]   Social Network    How would you rate your social network (family, work, friends)?: Adequate participation with social networks  Intimate Partner Violence: Not At Risk (10/03/2024)   Received from [facility]   HITS    Over the last 12 months how often did your partner physically hurt you?: Never    Over the last 12 months how often did your partner insult you or talk down to you?: Never    Over the last 12 months how often did your partner threaten you with physical harm?: Never    Over the last 12 months how often did your partner scream or curse at you?: Never  Depression (PHQ2-9): Not on file  Alcohol Screen: Not on file   Housing: Unknown (11/24/2024)   Received from [facility]    In the last 12 months, was there a time when you were not able to pay the mortgage or rent on time?: No    Number of Times Moved in the Last Year: Not on file    At any time in the past 12 months, were you homeless or living in a shelter (including now)?: No  Utilities: Not At Risk (11/24/2024)   Received from [facility]    In the past 12 months has the electric, gas, oil, or water company threatened to shut off services in your home?: No  Health Literacy: Not on file    Review of Systems:    Constitutional: No weight loss, fever, chills, weakness or fatigue HEENT: Eyes:  No change in vision               Ears, Nose, Throat:  No change in hearing or congestion Skin: No rash or itching Cardiovascular: No chest pain, chest pressure or palpitations   Respiratory: No SOB or cough Gastrointestinal: See HPI and otherwise negative Genitourinary: No dysuria or change in urinary frequency Neurological: No headache, dizziness or syncope Musculoskeletal: No new muscle or joint pain Hematologic: No bleeding or bruising Psychiatric: No history of depression or anxiety    Physical Exam:  Vital signs: BP 96/64   Pulse 74   Ht 5' 5 (1.651 m)   Wt 102 lb (46.3 kg)   BMI 16.97 kg/m   Constitutional: NAD, alert and cooperative Head:  Normocephalic and atraumatic. Eyes:   PEERL, EOMI. No icterus. Conjunctiva pink. Respiratory: Respirations even and unlabored. Lungs clear to auscultation bilaterally.   No wheezes, crackles, or rhonchi.  Cardiovascular:  Regular rate and rhythm. No peripheral edema, cyanosis or pallor.  Gastrointestinal:  Soft, nondistended, nontender. No rebound or guarding. Normal bowel sounds. No appreciable masses or hepatomegaly. Rectal:  Declines Msk:  Symmetrical without gross deformities. Without edema, no deformity or joint abnormality.  Neurologic:  Alert and  oriented x4;  grossly normal  neurologically.  Skin:   Dry and intact without significant lesions or rashes. Psychiatric: Oriented to person, place and time. Demonstrates good judgement and reason without abnormal affect or behaviors.  Physical Exam    RELEVANT LABS AND IMAGING: CBC    Component Value Date/Time   WBC 5.2 04/09/2023 2259   RBC 4.21 04/09/2023 2259   HGB 12.6 04/09/2023 2259   HCT 37.4 04/09/2023 2259   PLT 176 04/09/2023 2259   MCV 88.8 04/09/2023 2259   MCH 29.9 04/09/2023 2259   MCHC 33.7 04/09/2023 2259   RDW 11.9 04/09/2023 2259   LYMPHSABS 1.9 03/08/2023 2342   MONOABS 0.3 03/08/2023 2342   EOSABS 0.1 03/08/2023 2342   BASOSABS 0.0 03/08/2023 2342    CMP     Component Value Date/Time   NA 131 (L) 03/08/2023 2342   K 3.9 03/08/2023 2342   CL 103 03/08/2023 2342   CO2 24 03/08/2023 2342   GLUCOSE 98 03/08/2023 2342   BUN 13 03/08/2023 2342   CREATININE 0.74 03/08/2023 2342   CALCIUM 8.4 (L) 03/08/2023 2342   PROT 7.9 03/23/2022 1402   ALBUMIN 5.1 (H) 03/23/2022 1402   AST 19 03/23/2022 1402   ALT 14 03/23/2022 1402   ALKPHOS 55 03/23/2022 1402   BILITOT 0.7 03/23/2022 1402   GFRNONAA >60 03/08/2023 2342     Assessment/Plan:   Lower abdominal pain Chronic constipation Suspect patient's chronic constipation since childhood is likely leading to persistent abdominal pain.  She is not on a bowel regimen and has infrequent hard stools.  CTAP with contrast showing large stool burden.  Normal thyroid. - Fiber 1-2 times daily - If no improvement with fiber added MiraLAX 1 capful per day and adjust dose based on response - Increase water, increase fiber, increase exercise - Hyoscyamine  sublingual on an as needed basis (does not respond well to peppermint oil) - Follow-up with me in 6 to 8 weeks  Endometriosis S/p diagnostic lap with excision of endometriosis 11/24/2024  ARFID Anxiety Depression OCD BMI 16.97  Assigned to Dr. Albertus today (Thursday)  Sherronda Sweigert Mollie RIGGERS [facility] 01/15/2025, 9:16 AM  Cc: Juliane Che, PA "

## 2025-03-10 ENCOUNTER — Ambulatory Visit: Admitting: Gastroenterology
